# Patient Record
Sex: Female | Born: 1995
Health system: Southern US, Community
[De-identification: ages and names within clinical notes are randomized; demographics above are authoritative.]

## PROBLEM LIST (undated history)

## (undated) DIAGNOSIS — Q766 Other congenital malformations of ribs: Secondary | ICD-10-CM

## (undated) DIAGNOSIS — G43909 Migraine, unspecified, not intractable, without status migrainosus: Secondary | ICD-10-CM

## (undated) HISTORY — DX: Other congenital malformations of ribs: Q76.6

## (undated) HISTORY — DX: Migraine, unspecified, not intractable, without status migrainosus: G43.909

---

## 2017-05-29 ENCOUNTER — Encounter (HOSPITAL_COMMUNITY): Payer: Self-pay

## 2017-05-29 ENCOUNTER — Emergency Department (HOSPITAL_COMMUNITY)
Admission: EM | Admit: 2017-05-29 | Discharge: 2017-05-29 | Disposition: A | Discharge status: Home / Self Care | Payer: BLUE CROSS/BLUE SHIELD | Attending: Emergency Medicine | Admitting: Emergency Medicine

## 2017-05-29 DIAGNOSIS — R21 Rash and other nonspecific skin eruption: Secondary | ICD-10-CM | POA: Diagnosis present

## 2017-05-29 DIAGNOSIS — T7840XA Allergy, unspecified, initial encounter: Secondary | ICD-10-CM

## 2017-05-29 MED ORDER — PREDNISONE 10 MG PO TABS: 50.0000 mg | ORAL_TABLET | Freq: Every day | ORAL | 0 refills | Status: AC

## 2017-05-29 MED ORDER — DEXAMETHASONE SODIUM PHOSPHATE 10 MG/ML IJ SOLN
10.0000 mg | Freq: Once | INTRAMUSCULAR | Status: AC
  Administered 2017-05-29: 10 mg via INTRAMUSCULAR
  Filled 2017-05-29: qty 1

## 2017-05-29 MED ORDER — DIPHENHYDRAMINE HCL 25 MG PO CAPS
25.0000 mg | ORAL_CAPSULE | Freq: Once | ORAL | Status: AC
  Administered 2017-05-29: 25 mg via ORAL
  Filled 2017-05-29: qty 1

## 2017-05-29 MED ORDER — HYDROXYZINE HCL 25 MG PO TABS: 25.0000 mg | ORAL_TABLET | Freq: Three times a day (TID) | ORAL | 0 refills | Status: DC | PRN

## 2017-05-29 NOTE — ED Provider Notes (Signed)
[facility]-EMERGENCY DEPT Provider Note   CSN: [id number] Arrival date & time: 05/29/17  1036     History   Chief Complaint No chief complaint on file.   HPI Jane Parker is a 21 y.o. female presenting with allergic reaction to hair dye.   Pt states she used hair dye to color her hair on Monday. Soon after and Tuesday, she had itching and rash on the base of her neck where her hair touches, Yesterday she had spread of the rash to her whole scalp and hairline. Today she woke up with edematous swelling of her eyes. She denies swelling of tongue, lips, mouth or throat, she denies difficulty breathing, taking, or with PO intake. She took a benadryl on Tuesday and yesterday, and a vistaril last night. She has not taken anything today. She had a reaction to hair dye 6 years ago. She has not other medical problems and does not take any medications. She denies other new exposures including new detergents, soaps, foods, environments, or shampoos/conditioners.   HPI  History reviewed. No pertinent past medical history.  There are no active problems to display for this patient.   History reviewed. No pertinent surgical history.  OB History    No data available       Home Medications    Prior to Admission medications   Medication Sig Start Date End Date Taking? Authorizing Provider  hydrOXYzine (ATARAX/VISTARIL) 25 MG tablet Take 1 tablet (25 mg total) by mouth every 8 (eight) hours as needed for itching. 05/29/17   Shakia Sebastiano, PA-C  predniSONE (DELTASONE) 10 MG tablet Take 5 tablets (50 mg total) by mouth daily. 05/29/17 06/03/17  Atanacio Melnyk, PA-C    Family History No family history on file.  Social History Social History  Substance Use Topics  . Smoking status: Never Smoker  . Smokeless tobacco: Never Used  . Alcohol use Not on file     Allergies   Patient has no known allergies.   Review of Systems Review of Systems  Constitutional: Negative for chills and  fever.  HENT: Positive for ear pain and facial swelling. Negative for drooling, sinus pain, sinus pressure, sore throat, trouble swallowing and voice change.   Eyes: Negative for pain, discharge and visual disturbance.  Respiratory: Negative for cough, shortness of breath, wheezing and stridor.   Cardiovascular: Negative for chest pain.  Gastrointestinal: Negative for abdominal pain, nausea and vomiting.     Physical Exam Updated Vital Signs BP 125/78 (BP Location: Left Arm)   Pulse 84   Temp (!) 97.5 F (36.4 C) (Oral)   Resp 18   SpO2 100%   Physical Exam  Constitutional: She is oriented to person, place, and time. She appears well-developed and well-nourished. No distress.  HENT:  Head: Normocephalic and atraumatic.  Right Ear: Tympanic membrane, external ear and ear canal normal.  Left Ear: External ear and ear canal normal. Tympanic membrane is not perforated, not erythematous and not bulging.  Ears:  Nose: Nose normal.  Mouth/Throat: Uvula is midline, oropharynx is clear and moist and mucous membranes are normal.  TMs nonerythematous and not bulging. L TM with serous filled lesions/blisters. No fluid behind the TM.  No swelling of lips, tongue or throat. Pt voice at baseline. No irritation, erythema, or swelling of OP.   Eyes: Pupils are equal, round, and reactive to light. Conjunctivae and EOM are normal.  Neck: Normal range of motion.  Cardiovascular: Normal rate, regular rhythm and intact distal pulses.   Pulmonary/Chest:  Effort normal and breath sounds normal. She has no decreased breath sounds. She has no wheezes. She has no rhonchi. She has no rales.  Pt talking in complete sentences without difficulty. Good air movement in all lung fields.   Abdominal: Soft. She exhibits no distension. There is no tenderness.  Musculoskeletal: Normal range of motion.  Lymphadenopathy:    She has no cervical adenopathy.  Neurological: She is alert and oriented to person, place, and  time.  Skin: Skin is warm. No rash noted.  Macular rash along hairline and in scalp. Some lesions vesicular and some macular. Extension of rash to posterior neck. No rash notes elsewhere. No rash on face or chest. No rash on anterior neck. No hives or urticaria seen.   Psychiatric: She has a normal mood and affect.  Nursing note and vitals reviewed.    ED Treatments / Results  Labs (all labs ordered are listed, but only abnormal results are displayed) Labs Reviewed - No data to display  EKG  EKG Interpretation None       Radiology No results found.  Procedures Procedures (including critical care time)  Medications Ordered in ED Medications  dexamethasone (DECADRON) injection 10 mg (10 mg Intramuscular Given 05/29/17 1426)  diphenhydrAMINE (BENADRYL) capsule 25 mg (25 mg Oral Given 05/29/17 1426)     Initial Impression / Assessment and Plan / ED Course  I have reviewed the triage vital signs and the nursing notes.  Pertinent labs & imaging results that were available during my care of the patient were reviewed by me and considered in my medical decision making (see chart for details).     Pt presenting with reaction to hair dye. Began as rash, and has cause periorbital edema. No oral swelling or respiratory compromise. Pt speaking in full sentences and can tolerate PO easily. Will give decadron shot and benadryl. Rx for prednisne and vistaril. Pt to monitor closely for progression of swelling. Stressed importance of immediate return to ED if oral swelling, throat swelling, or difficulty breathing. Pt tolerated PO in the ED. At this time, pt appears safe for discharge. Strict return precautions given. Pt states she understands and agrees to plan.   Final Clinical Impressions(s) / ED Diagnoses   Final diagnoses:  Allergic reaction, initial encounter    New Prescriptions Discharge Medication List as of 05/29/2017  2:51 PM    START taking these medications   Details    hydrOXYzine (ATARAX/VISTARIL) 25 MG tablet Take 1 tablet (25 mg total) by mouth every 8 (eight) hours as needed for itching., Starting Thu 05/29/2017, Print    predniSONE (DELTASONE) 10 MG tablet Take 5 tablets (50 mg total) by mouth daily., Starting Thu 05/29/2017, Until Tue 06/03/2017, Print         Leaf, Takeem Krotzer, PA-C 05/29/17 2247    Loren Racer, MD 05/30/17 [phone]

## 2017-05-29 NOTE — Discharge Instructions (Signed)
Take prednisone as prescribed. Use Vistaril 3 times a day as needed for itching.  Follow-up with your primary care doctor in 3-7 days if symptoms are improving, but not gone. It is very important that you pay close attention to how your symptoms are progressing. If there is any worsening, you develop swelling of your tongue/lips/mouth, difficulty breathing, or throat itching, you must come back to the emergency room immediately.

## 2017-05-29 NOTE — ED Triage Notes (Signed)
Patient here with bilateral eye swelling after allergic reaction to hair dye that she used on Monday. No SOB, no throat swelling. Has taken benadryl

## 2017-09-04 ENCOUNTER — Other Ambulatory Visit: Payer: Self-pay

## 2017-09-04 ENCOUNTER — Ambulatory Visit (INDEPENDENT_AMBULATORY_CARE_PROVIDER_SITE_OTHER): Payer: BLUE CROSS/BLUE SHIELD | Admitting: Physician Assistant

## 2017-09-04 ENCOUNTER — Encounter: Payer: Self-pay | Admitting: Physician Assistant

## 2017-09-04 VITALS — BP 110/66 | HR 75 | Temp 98.1°F | Resp 18 | Ht 64.96 in | Wt 158.6 lb

## 2017-09-04 DIAGNOSIS — Z13220 Encounter for screening for lipoid disorders: Secondary | ICD-10-CM | POA: Diagnosis not present

## 2017-09-04 DIAGNOSIS — Z13228 Encounter for screening for other metabolic disorders: Secondary | ICD-10-CM | POA: Diagnosis not present

## 2017-09-04 DIAGNOSIS — Z1329 Encounter for screening for other suspected endocrine disorder: Secondary | ICD-10-CM

## 2017-09-04 DIAGNOSIS — Z23 Encounter for immunization: Secondary | ICD-10-CM

## 2017-09-04 DIAGNOSIS — Z1389 Encounter for screening for other disorder: Secondary | ICD-10-CM | POA: Diagnosis not present

## 2017-09-04 DIAGNOSIS — Z0000 Encounter for general adult medical examination without abnormal findings: Secondary | ICD-10-CM

## 2017-09-04 DIAGNOSIS — Z111 Encounter for screening for respiratory tuberculosis: Secondary | ICD-10-CM

## 2017-09-04 DIAGNOSIS — Z130 Encounter for screening for diseases of the blood and blood-forming organs and certain disorders involving the immune mechanism: Secondary | ICD-10-CM

## 2017-09-04 LAB — POCT URINALYSIS DIP (MANUAL ENTRY)
BILIRUBIN UA: NEGATIVE mg/dL
Bilirubin, UA: NEGATIVE
Glucose, UA: NEGATIVE mg/dL
Leukocytes, UA: NEGATIVE
Nitrite, UA: NEGATIVE
PROTEIN UA: NEGATIVE mg/dL
RBC UA: NEGATIVE
Urobilinogen, UA: 0.2 E.U./dL
pH, UA: 6 (ref 5.0–8.0)

## 2017-09-04 NOTE — Patient Instructions (Addendum)
We should have your blood results back within the next few days. Sign up for mychart so we can release your blood work that way. When you are ready to have your pap smear, please return to office. Thank you for letting me participate in your health and well being.   Health Maintenance, Female Adopting a healthy lifestyle and getting preventive care can go a long way to promote health and wellness. Talk with your health care provider about what schedule of regular examinations is right for you. This is a good chance for you to check in with your provider about disease prevention and staying healthy. In between checkups, there are plenty of things you can do on your own. Experts have done a lot of research about which lifestyle changes and preventive measures are most likely to keep you healthy. Ask your health care provider for more information. Weight and diet Eat a healthy diet  Be sure to include plenty of vegetables, fruits, low-fat dairy products, and lean protein.  Do not eat a lot of foods high in solid fats, added sugars, or salt.  Get regular exercise. This is one of the most important things you can do for your health. ? Most adults should exercise for at least 150 minutes each week. The exercise should increase your heart rate and make you sweat (moderate-intensity exercise). ? Most adults should also do strengthening exercises at least twice a week. This is in addition to the moderate-intensity exercise.  Maintain a healthy weight  Body mass index (BMI) is a measurement that can be used to identify possible weight problems. It estimates body fat based on height and weight. Your health care provider can help determine your BMI and help you achieve or maintain a healthy weight.  For females 40 years of age and older: ? A BMI below 18.5 is considered underweight. ? A BMI of 18.5 to 24.9 is normal. ? A BMI of 25 to 29.9 is considered overweight. ? A BMI of 30 and above is considered  obese.  Watch levels of cholesterol and blood lipids  You should start having your blood tested for lipids and cholesterol at 21 years of age, then have this test every 5 years.  You may need to have your cholesterol levels checked more often if: ? Your lipid or cholesterol levels are high. ? You are older than 21 years of age. ? You are at high risk for heart disease.  Cancer screening Lung Cancer  Lung cancer screening is recommended for adults 85-15 years old who are at high risk for lung cancer because of a history of smoking.  A yearly low-dose CT scan of the lungs is recommended for people who: ? Currently smoke. ? Have quit within the past 15 years. ? Have at least a 30-pack-year history of smoking. A pack year is smoking an average of one pack of cigarettes a day for 1 year.  Yearly screening should continue until it has been 15 years since you quit.  Yearly screening should stop if you develop a health problem that would prevent you from having lung cancer treatment.  Breast Cancer  Practice breast self-awareness. This means understanding how your breasts normally appear and feel.  It also means doing regular breast self-exams. Let your health care provider know about any changes, no matter how small.  If you are in your 20s or 30s, you should have a clinical breast exam (CBE) by a health care provider every 1-3 years as part  of a regular health exam.  If you are 30 or older, have a CBE every year. Also consider having a breast X-ray (mammogram) every year.  If you have a family history of breast cancer, talk to your health care provider about genetic screening.  If you are at high risk for breast cancer, talk to your health care provider about having an MRI and a mammogram every year.  Breast cancer gene (BRCA) assessment is recommended for women who have family members with BRCA-related cancers. BRCA-related cancers  include: ? Breast. ? Ovarian. ? Tubal. ? Peritoneal cancers.  Results of the assessment will determine the need for genetic counseling and BRCA1 and BRCA2 testing.  Cervical Cancer Your health care provider may recommend that you be screened regularly for cancer of the pelvic organs (ovaries, uterus, and vagina). This screening involves a pelvic examination, including checking for microscopic changes to the surface of your cervix (Pap test). You may be encouraged to have this screening done every 3 years, beginning at age 70.  For women ages 17-65, health care providers may recommend pelvic exams and Pap testing every 3 years, or they may recommend the Pap and pelvic exam, combined with testing for human papilloma virus (HPV), every 5 years. Some types of HPV increase your risk of cervical cancer. Testing for HPV may also be done on women of any age with unclear Pap test results.  Other health care providers may not recommend any screening for nonpregnant women who are considered low risk for pelvic cancer and who do not have symptoms. Ask your health care provider if a screening pelvic exam is right for you.  If you have had past treatment for cervical cancer or a condition that could lead to cancer, you need Pap tests and screening for cancer for at least 20 years after your treatment. If Pap tests have been discontinued, your risk factors (such as having a new sexual partner) need to be reassessed to determine if screening should resume. Some women have medical problems that increase the chance of getting cervical cancer. In these cases, your health care provider may recommend more frequent screening and Pap tests.  Colorectal Cancer  This type of cancer can be detected and often prevented.  Routine colorectal cancer screening usually begins at 21 years of age and continues through 20 years of age.  Your health care provider may recommend screening at an earlier age if you have risk factors  for colon cancer.  Your health care provider may also recommend using home test kits to check for hidden blood in the stool.  A small camera at the end of a tube can be used to examine your colon directly (sigmoidoscopy or colonoscopy). This is done to check for the earliest forms of colorectal cancer.  Routine screening usually begins at age 85.  Direct examination of the colon should be repeated every 5-10 years through 21 years of age. However, you may need to be screened more often if early forms of precancerous polyps or small growths are found.  Skin Cancer  Check your skin from head to toe regularly.  Tell your health care provider about any new moles or changes in moles, especially if there is a change in a mole's shape or color.  Also tell your health care provider if you have a mole that is larger than the size of a pencil eraser.  Always use sunscreen. Apply sunscreen liberally and repeatedly throughout the day.  Protect yourself by wearing long  sleeves, pants, a wide-brimmed hat, and sunglasses whenever you are outside.  Heart disease, diabetes, and high blood pressure  High blood pressure causes heart disease and increases the risk of stroke. High blood pressure is more likely to develop in: ? People who have blood pressure in the high end of the normal range (130-139/85-89 mm Hg). ? People who are overweight or obese. ? People who are African American.  If you are 93-79 years of age, have your blood pressure checked every 3-5 years. If you are 85 years of age or older, have your blood pressure checked every year. You should have your blood pressure measured twice-once when you are at a hospital or clinic, and once when you are not at a hospital or clinic. Record the average of the two measurements. To check your blood pressure when you are not at a hospital or clinic, you can use: ? An automated blood pressure machine at a pharmacy. ? A home blood pressure monitor.  If  you are between 74 years and 26 years old, ask your health care provider if you should take aspirin to prevent strokes.  Have regular diabetes screenings. This involves taking a blood sample to check your fasting blood sugar level. ? If you are at a normal weight and have a low risk for diabetes, have this test once every three years after 21 years of age. ? If you are overweight and have a high risk for diabetes, consider being tested at a younger age or more often. Preventing infection Hepatitis B  If you have a higher risk for hepatitis B, you should be screened for this virus. You are considered at high risk for hepatitis B if: ? You were born in a country where hepatitis B is common. Ask your health care provider which countries are considered high risk. ? Your parents were born in a high-risk country, and you have not been immunized against hepatitis B (hepatitis B vaccine). ? You have HIV or AIDS. ? You use needles to inject street drugs. ? You live with someone who has hepatitis B. ? You have had sex with someone who has hepatitis B. ? You get hemodialysis treatment. ? You take certain medicines for conditions, including cancer, organ transplantation, and autoimmune conditions.  Hepatitis C  Blood testing is recommended for: ? Everyone born from [birth year] through 1965. ? Anyone with known risk factors for hepatitis C.  Sexually transmitted infections (STIs)  You should be screened for sexually transmitted infections (STIs) including gonorrhea and chlamydia if: ? You are sexually active and are younger than 21 years of age. ? You are older than 21 years of age and your health care provider tells you that you are at risk for this type of infection. ? Your sexual activity has changed since you were last screened and you are at an increased risk for chlamydia or gonorrhea. Ask your health care provider if you are at risk.  If you do not have HIV, but are at risk, it may be recommended  that you take a prescription medicine daily to prevent HIV infection. This is called pre-exposure prophylaxis (PrEP). You are considered at risk if: ? You are sexually active and do not regularly use condoms or know the HIV status of your partner(s). ? You take drugs by injection. ? You are sexually active with a partner who has HIV.  Talk with your health care provider about whether you are at high risk of being infected with HIV. If  you choose to begin PrEP, you should first be tested for HIV. You should then be tested every 3 months for as long as you are taking PrEP. Pregnancy  If you are premenopausal and you may become pregnant, ask your health care provider about preconception counseling.  If you may become pregnant, take 400 to 800 micrograms (mcg) of folic acid every day.  If you want to prevent pregnancy, talk to your health care provider about birth control (contraception). Osteoporosis and menopause  Osteoporosis is a disease in which the bones lose minerals and strength with aging. This can result in serious bone fractures. Your risk for osteoporosis can be identified using a bone density scan.  If you are 4 years of age or older, or if you are at risk for osteoporosis and fractures, ask your health care provider if you should be screened.  Ask your health care provider whether you should take a calcium or vitamin D supplement to lower your risk for osteoporosis.  Menopause may have certain physical symptoms and risks.  Hormone replacement therapy may reduce some of these symptoms and risks. Talk to your health care provider about whether hormone replacement therapy is right for you. Follow these instructions at home:  Schedule regular health, dental, and eye exams.  Stay current with your immunizations.  Do not use any tobacco products including cigarettes, chewing tobacco, or electronic cigarettes.  If you are pregnant, do not drink alcohol.  If you are  breastfeeding, limit how much and how often you drink alcohol.  Limit alcohol intake to no more than 1 drink per day for nonpregnant women. One drink equals 12 ounces of beer, 5 ounces of wine, or 1 ounces of hard liquor.  Do not use street drugs.  Do not share needles.  Ask your health care provider for help if you need support or information about quitting drugs.  Tell your health care provider if you often feel depressed.  Tell your health care provider if you have ever been abused or do not feel safe at home. This information is not intended to replace advice given to you by your health care provider. Make sure you discuss any questions you have with your health care provider. Document Released: 03/18/2011 Document Revised: 02/08/2016 Document Reviewed: 06/06/2015 Elsevier Interactive Patient Education  2018 [employer].   IF you received an x-ray today, you will receive an invoice from [facility]. Please contact [facility] at [phone] with questions or concerns regarding your invoice.   IF you received labwork today, you will receive an invoice from [place]. Please contact [facility] at [phone] with questions or concerns regarding your invoice.   Our billing staff will not be able to assist you with questions regarding bills from these companies.  You will be contacted with the lab results as soon as they are available. The fastest way to get your results is to activate your My Chart account. Instructions are located on the last page of this paperwork. If you have not heard from [place] regarding the results in 2 weeks, please contact this office.

## 2017-09-04 NOTE — Progress Notes (Signed)
Jane Parker  MRN: [id number] DOB: April 04, 1996  Subjective:  Pt is a 21 y.o. female who presents for annual physical exam.  Patient is fasting today.   Last menstrual cycle: Patient is currently on menstrual cycle.  Notes that her typically regular.  Denies dysmenorrhea or menorrhagia. Last dental exam: 2016, brushes twice daily Last vision exam: Never Last pap smear: Never  Vaccinations      Tetanus: 2017      HPV: Completed in 2015  There are no active problems to display for this patient.   Current Outpatient Medications on File Prior to Visit  Medication Sig Dispense Refill  . hydrOXYzine (ATARAX/VISTARIL) 25 MG tablet Take 1 tablet (25 mg total) by mouth every 8 (eight) hours as needed for itching. (Patient not taking: Reported on 09/04/2017) 30 tablet 0   No current facility-administered medications on file prior to visit.     No Known Allergies  Social History   Socioeconomic History  . Marital status: Single    Spouse name: None  . Number of children: None  . Years of education: None  . Highest education level: High school graduate  Social Needs  . Financial resource strain: None  . Food insecurity - worry: Never true  . Food insecurity - inability: Never true  . Transportation needs - medical: No  . Transportation needs - non-medical: No  Occupational History  . Occupation: Student     Comment: [employer]  Tobacco Use  . Smoking status: Never Smoker  . Smokeless tobacco: Never Used  Substance and Sexual Activity  . Alcohol use: Yes    Comment: occ  . Drug use: No  . Sexual activity: Not Currently    Partners: Male    Birth control/protection: Condom  Other Topics Concern  . None  Social History Narrative   Pt grew up in [place]. 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 Attends [employer] for [employer].       Diet: Eats anything. Likes junk food but will eat veggies and fruits. Drinks milk, water, and gatorade. Does not  take a multivitamin.     History reviewed. No pertinent surgical history.  Family History  Problem Relation Age of Onset  . Alzheimer's disease Maternal Grandmother     Review of Systems  Constitutional: Negative for activity change, appetite change, chills, diaphoresis, fatigue, fever and unexpected weight change.  HENT: Negative for congestion, dental problem, drooling, ear discharge, ear pain, facial swelling, hearing loss, mouth sores, nosebleeds, postnasal drip, rhinorrhea, sinus pressure, sinus pain, sneezing, sore throat, tinnitus, trouble swallowing and voice change.   Eyes: Negative for photophobia, pain, discharge, redness, itching and visual disturbance.  Respiratory: Negative for apnea, cough, choking, chest tightness, shortness of breath, wheezing and stridor.   Cardiovascular: Negative for chest pain, palpitations and leg swelling.  Gastrointestinal: Negative for abdominal distention, abdominal pain, anal bleeding, blood in stool, constipation, diarrhea, nausea, rectal pain and vomiting.  Endocrine: Negative for cold intolerance, heat intolerance, polydipsia, polyphagia and polyuria.  Genitourinary: Negative for decreased urine volume, difficulty urinating, dyspareunia, dysuria, enuresis, flank pain, frequency, genital sores, hematuria, menstrual problem, pelvic pain, urgency, vaginal bleeding, vaginal discharge and vaginal pain.  Musculoskeletal: Negative for arthralgias, back pain, gait problem, joint swelling, myalgias, neck pain and neck stiffness.  Skin: Negative for color change, pallor, rash and wound.  Allergic/Immunologic: Negative for environmental allergies, food allergies and immunocompromised state.  Neurological: Negative for dizziness, tremors, seizures, syncope, facial asymmetry, speech difficulty, weakness,  light-headedness, numbness and headaches.  Hematological: Negative for adenopathy. Does not bruise/bleed easily.  Psychiatric/Behavioral: Negative for  agitation, behavioral problems, confusion, decreased concentration, dysphoric mood, hallucinations, self-injury, sleep disturbance and suicidal ideas. The patient is not nervous/anxious and is not hyperactive.     Objective:  BP 110/66 (BP Location: Left Arm, Patient Position: Sitting, Cuff Size: Normal)   Pulse 75   Temp 98.1 F (36.7 C) (Oral)   Resp 18   Ht 5' 4.96" (1.65 m)   Wt 158 lb 9.6 oz (71.9 kg)   LMP 09/02/2017 (Exact Date)   SpO2 100%   BMI 26.42 kg/m   Physical Exam  Constitutional: She is oriented to person, place, and time and well-developed, well-nourished, and in no distress.  HENT:  Head: Normocephalic and atraumatic.  Right Ear: Hearing, tympanic membrane, external ear and ear canal normal.  Left Ear: Hearing, tympanic membrane, external ear and ear canal normal.  Nose: Nose normal.  Mouth/Throat: Uvula is midline, oropharynx is clear and moist and mucous membranes are normal. No oropharyngeal exudate.  Eyes: Conjunctivae, EOM and lids are normal. Pupils are equal, round, and reactive to light. No scleral icterus.  Neck: Trachea normal and normal range of motion. No thyroid mass and no thyromegaly present.  Cardiovascular: Normal rate, regular rhythm, normal heart sounds and intact distal pulses.  Pulmonary/Chest: Effort normal and breath sounds normal.  Abdominal: Soft. Normal appearance and bowel sounds are normal. There is no tenderness.  Lymphadenopathy:       Head (right side): No tonsillar, no preauricular, no posterior auricular and no occipital adenopathy present.       Head (left side): No tonsillar, no preauricular, no posterior auricular and no occipital adenopathy present.    She has no cervical adenopathy.       Right: No supraclavicular adenopathy present.       Left: No supraclavicular adenopathy present.  Neurological: She is alert and oriented to person, place, and time. She has normal sensation, normal strength and normal reflexes. Gait normal.    Skin: Skin is warm and dry.  Psychiatric: Affect normal.    Visual Acuity Screening   Right eye Left eye Both eyes  Without correction: 20/15 20/15 20/15   With correction:       Assessment and Plan :  Discussed healthy lifestyle, diet, exercise, preventative care, vaccinations, and addressed patient's concerns. Plan for follow up in one year. Otherwise, plan for specific conditions below.  1. Annual physical exam Patient declines Pap smear today.  Await lab results.  School form completed and given to patient.  2. Need for prophylactic vaccination and inoculation against influenza - Flu Vaccine QUAD 36+ mos IM  3. Screening, anemia, deficiency, iron - CBC with Differential/Platelet  4. Screening for metabolic disorder - XKG81+EHUD  5. Screening, lipid - Lipid panel  6. Screening for thyroid disorder - TSH  7. Screening for hematuria or proteinuria - POCT urinalysis dipstick  8. Screening-pulmonary TB - QuantiFERON-TB Gold Plus  Tenna Delaine, PA-C  [facility] 09/04/2017 9:41 AM

## 2017-09-08 LAB — CBC WITH DIFFERENTIAL/PLATELET
BASOS: 1 %
Basophils Absolute: 0 10*3/uL (ref 0.0–0.2)
EOS (ABSOLUTE): 0.1 10*3/uL (ref 0.0–0.4)
EOS: 3 %
HEMATOCRIT: 32.2 % — AB (ref 34.0–46.6)
Hemoglobin: 10 g/dL — ABNORMAL LOW (ref 11.1–15.9)
Immature Grans (Abs): 0 10*3/uL (ref 0.0–0.1)
Immature Granulocytes: 0 %
LYMPHS ABS: 1 10*3/uL (ref 0.7–3.1)
Lymphs: 29 %
MCH: 22.8 pg — AB (ref 26.6–33.0)
MCHC: 31.1 g/dL — AB (ref 31.5–35.7)
MCV: 73 fL — AB (ref 79–97)
MONOS ABS: 0.4 10*3/uL (ref 0.1–0.9)
Monocytes: 12 %
Neutrophils Absolute: 2 10*3/uL (ref 1.4–7.0)
Neutrophils: 55 %
Platelets: 211 10*3/uL (ref 150–379)
RBC: 4.39 x10E6/uL (ref 3.77–5.28)
RDW: 14.9 % (ref 12.3–15.4)
WBC: 3.5 10*3/uL (ref 3.4–10.8)

## 2017-09-08 LAB — CMP14+EGFR
ALBUMIN: 4.2 g/dL (ref 3.5–5.5)
ALK PHOS: 41 IU/L (ref 39–117)
ALT: 11 IU/L (ref 0–32)
AST: 19 IU/L (ref 0–40)
Albumin/Globulin Ratio: 1.9 (ref 1.2–2.2)
BUN / CREAT RATIO: 18 (ref 9–23)
BUN: 11 mg/dL (ref 6–20)
Bilirubin Total: 0.4 mg/dL (ref 0.0–1.2)
CALCIUM: 9 mg/dL (ref 8.7–10.2)
CO2: 20 mmol/L (ref 20–29)
CREATININE: 0.61 mg/dL (ref 0.57–1.00)
Chloride: 104 mmol/L (ref 96–106)
GFR calc Af Amer: 150 mL/min/{1.73_m2} (ref >59)
GFR, EST NON AFRICAN AMERICAN: 130 mL/min/{1.73_m2} (ref >59)
GLOBULIN, TOTAL: 2.2 g/dL (ref 1.5–4.5)
Glucose: 80 mg/dL (ref 65–99)
Potassium: 4.3 mmol/L (ref 3.5–5.2)
SODIUM: 139 mmol/L (ref 134–144)
Total Protein: 6.4 g/dL (ref 6.0–8.5)

## 2017-09-08 LAB — QUANTIFERON-TB GOLD PLUS
QUANTIFERON NIL VALUE: 0.02 [IU]/mL
QUANTIFERON TB2 AG VALUE: 0.02 [IU]/mL
QuantiFERON TB1 Ag Value: 0.02 IU/mL
QuantiFERON-TB Gold Plus: NEGATIVE

## 2017-09-08 LAB — LIPID PANEL
CHOL/HDL RATIO: 2.9 ratio (ref 0.0–4.4)
Cholesterol, Total: 140 mg/dL (ref 100–199)
HDL: 49 mg/dL (ref >39)
LDL Calculated: 79 mg/dL (ref 0–99)
Triglycerides: 59 mg/dL (ref 0–149)
VLDL Cholesterol Cal: 12 mg/dL (ref 5–40)

## 2017-09-08 LAB — TSH: TSH: 1.8 u[IU]/mL (ref 0.450–4.500)

## 2018-04-03 DIAGNOSIS — G43909 Migraine, unspecified, not intractable, without status migrainosus: Secondary | ICD-10-CM | POA: Diagnosis not present

## 2018-05-01 DIAGNOSIS — M898X1 Other specified disorders of bone, shoulder: Secondary | ICD-10-CM | POA: Diagnosis not present

## 2018-05-01 DIAGNOSIS — S46912A Strain of unspecified muscle, fascia and tendon at shoulder and upper arm level, left arm, initial encounter: Secondary | ICD-10-CM | POA: Diagnosis not present

## 2018-05-01 DIAGNOSIS — M25512 Pain in left shoulder: Secondary | ICD-10-CM | POA: Diagnosis not present

## 2018-05-25 DIAGNOSIS — Z23 Encounter for immunization: Secondary | ICD-10-CM | POA: Diagnosis not present

## 2018-07-07 DIAGNOSIS — R109 Unspecified abdominal pain: Secondary | ICD-10-CM | POA: Diagnosis not present

## 2018-12-01 DIAGNOSIS — J020 Streptococcal pharyngitis: Secondary | ICD-10-CM | POA: Diagnosis not present

## 2019-01-01 ENCOUNTER — Other Ambulatory Visit: Payer: Self-pay

## 2019-01-04 ENCOUNTER — Ambulatory Visit (INDEPENDENT_AMBULATORY_CARE_PROVIDER_SITE_OTHER): Payer: BLUE CROSS/BLUE SHIELD | Admitting: Family Medicine

## 2019-01-04 ENCOUNTER — Other Ambulatory Visit: Payer: Self-pay

## 2019-01-04 ENCOUNTER — Encounter: Payer: Self-pay | Admitting: Family Medicine

## 2019-01-04 VITALS — BP 110/62 | HR 73 | Temp 97.9°F | Wt 168.0 lb

## 2019-01-04 DIAGNOSIS — D508 Other iron deficiency anemias: Secondary | ICD-10-CM

## 2019-01-04 DIAGNOSIS — D649 Anemia, unspecified: Secondary | ICD-10-CM | POA: Diagnosis not present

## 2019-01-04 DIAGNOSIS — N920 Excessive and frequent menstruation with regular cycle: Secondary | ICD-10-CM | POA: Insufficient documentation

## 2019-01-04 DIAGNOSIS — R5383 Other fatigue: Secondary | ICD-10-CM | POA: Diagnosis not present

## 2019-01-04 DIAGNOSIS — G43409 Hemiplegic migraine, not intractable, without status migrainosus: Secondary | ICD-10-CM

## 2019-01-04 DIAGNOSIS — G43809 Other migraine, not intractable, without status migrainosus: Secondary | ICD-10-CM

## 2019-01-04 LAB — CBC WITH DIFFERENTIAL/PLATELET
Basophils Absolute: 0 10*3/uL (ref 0.0–0.1)
Basophils Relative: 0.9 % (ref 0.0–3.0)
Eosinophils Absolute: 0.1 10*3/uL (ref 0.0–0.7)
Eosinophils Relative: 3.1 % (ref 0.0–5.0)
HCT: 33.7 % — ABNORMAL LOW (ref 36.0–46.0)
Hemoglobin: 10.7 g/dL — ABNORMAL LOW (ref 12.0–15.0)
Lymphocytes Relative: 47.8 % — ABNORMAL HIGH (ref 12.0–46.0)
Lymphs Abs: 2.1 10*3/uL (ref 0.7–4.0)
MCHC: 31.7 g/dL (ref 30.0–36.0)
MCV: 70.1 fl — ABNORMAL LOW (ref 78.0–100.0)
Monocytes Absolute: 0.5 10*3/uL (ref 0.1–1.0)
Monocytes Relative: 11.3 % (ref 3.0–12.0)
Neutro Abs: 1.6 10*3/uL (ref 1.4–7.7)
Neutrophils Relative %: 36.9 % — ABNORMAL LOW (ref 43.0–77.0)
Platelets: 255 10*3/uL (ref 150.0–400.0)
RBC: 4.8 Mil/uL (ref 3.87–5.11)
RDW: 15.9 % — ABNORMAL HIGH (ref 11.5–15.5)
WBC: 4.4 10*3/uL (ref 4.0–10.5)

## 2019-01-04 LAB — IBC + FERRITIN
Ferritin: 3.8 ng/mL — ABNORMAL LOW (ref 10.0–291.0)
Iron: 36 ug/dL — ABNORMAL LOW (ref 42–145)
Saturation Ratios: 6.2 % — ABNORMAL LOW (ref 20.0–50.0)
Transferrin: 414 mg/dL — ABNORMAL HIGH (ref 212.0–360.0)

## 2019-01-04 LAB — COMPREHENSIVE METABOLIC PANEL
ALT: 15 U/L (ref 0–35)
AST: 19 U/L (ref 0–37)
Albumin: 4.8 g/dL (ref 3.5–5.2)
Alkaline Phosphatase: 49 U/L (ref 39–117)
BUN: 10 mg/dL (ref 6–23)
CO2: 29 mEq/L (ref 19–32)
Calcium: 10.2 mg/dL (ref 8.4–10.5)
Chloride: 100 mEq/L (ref 96–112)
Creatinine, Ser: 0.62 mg/dL (ref 0.40–1.20)
GFR: 119.1 mL/min (ref >60.00)
Glucose, Bld: 83 mg/dL (ref 70–99)
Potassium: 4.3 mEq/L (ref 3.5–5.1)
Sodium: 137 mEq/L (ref 135–145)
Total Bilirubin: 0.6 mg/dL (ref 0.2–1.2)
Total Protein: 7.6 g/dL (ref 6.0–8.3)

## 2019-01-04 LAB — VITAMIN D 25 HYDROXY (VIT D DEFICIENCY, FRACTURES): VITD: 24.1 ng/mL — ABNORMAL LOW (ref 30.00–100.00)

## 2019-01-04 LAB — VITAMIN B12: Vitamin B-12: 735 pg/mL (ref 211–911)

## 2019-01-04 LAB — TSH: TSH: 1.68 u[IU]/mL (ref 0.35–4.50)

## 2019-01-04 MED ORDER — SUMATRIPTAN SUCCINATE 100 MG PO TABS: ORAL_TABLET | ORAL | 2 refills | Status: DC

## 2019-01-04 MED ORDER — NORETHIN-ETH ESTRAD-FE BIPHAS 1 MG-10 MCG / 10 MCG PO TABS: 1.0000 | ORAL_TABLET | Freq: Every day | ORAL | 11 refills | Status: DC

## 2019-01-04 NOTE — Progress Notes (Signed)
Jane Parker DOB: December 15, 1995 Encounter date: 01/04/2019  This is a 23 y.o. female who presents to establish care. Chief Complaint  Patient presents with  . New Patient (Initial Visit)    discuss bc    History of present illness: Never been on birth control in past. Wants to consider this. Periods have been heavier with more cramping. Once a month. Used to last 5 days but now 6-7. On heavy days having to change tampons q hour. Has cramping before it starts and then 2-3 days of menses. Those are heaviest bleeding days. Has been like this last few months. Sister and mom both have heavy periods. Not had issues with excessive bleeding in past.   Also has some borderline anemia. Has had this on and off for years.  Migraines: complicated. Dx at age 46. Did MRI evaluation. Has been having migraines since age 61 - blurred vision, memory loss, left side of body goes numb, vomiting. Usually lasts a couple of hours. When first happened she couldn't even talk. Gets these intermittently. Think stress induced. Some diet induced. Hasn't had one since November. Takes excedrin migraine. Has tried topamax, but has hard time with side effects. Starts with blurred vision, then numbness in hand.  LMP was 12/23/18   Past Medical History:  Diagnosis Date  . 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 Migraines    History reviewed. No pertinent surgical history. No Known Allergies Current Meds  Medication Sig  . cyanocobalamin 1000 MCG tablet Take 1,000 mcg by mouth daily.   Social History   Tobacco Use  . Smoking status: Never Smoker  . Smokeless tobacco: Never Used  Substance Use Topics  . Alcohol use: Yes    Comment: occ   Family History  Problem Relation Age of Onset  . Asthma Mother   . Depression Mother   . Miscarriages / [place] Mother        1 miscarriage with twins  . Other Father        no relationship with dad  . Alzheimer's disease Maternal Grandmother   . Depression Maternal  Grandmother   . Heart attack Paternal Grandfather 50  . Kidney disease Paternal Grandfather   . Renal cancer Paternal Grandfather   . Bleeding Disorder Sister      Review of Systems  Constitutional: Negative for chills, fatigue and fever.  Respiratory: Negative for cough, chest tightness, shortness of breath and wheezing.   Cardiovascular: Negative for chest pain, palpitations and leg swelling.  Gastrointestinal: Abdominal pain: cramping w mense.  Genitourinary: Positive for menstrual problem.  Skin: Positive for pallor.  Neurological: Positive for numbness (w headache) and headaches.  Psychiatric/Behavioral: The patient is not nervous/anxious.     Objective:  BP 110/62 (BP Location: Left Arm, Patient Position: Sitting, Cuff Size: Normal)   Pulse 73   Temp 97.9 F (36.6 C) (Oral)   Wt 168 lb (76.2 kg)   SpO2 99%   BMI 27.99 kg/m   Weight: 168 lb (76.2 kg)   BP Readings from Last 3 Encounters:  01/04/19 110/62  09/04/17 110/66  05/29/17 125/78   Wt Readings from Last 3 Encounters:  01/04/19 168 lb (76.2 kg)  09/04/17 158 lb 9.6 oz (71.9 kg)    Physical Exam Constitutional:      General: She is not in acute distress.    Appearance: She is well-developed.  Eyes:     Comments: Pale conjunctiva  Cardiovascular:     Rate and Rhythm: Normal rate  and regular rhythm.     Heart sounds: Normal heart sounds. No murmur. No friction rub.  Pulmonary:     Effort: Pulmonary effort is normal. No respiratory distress.     Breath sounds: Normal breath sounds. No wheezing or rales.  Abdominal:     General: Abdomen is flat. Bowel sounds are normal. There is no distension.     Palpations: Abdomen is soft.     Tenderness: There is no abdominal tenderness. There is no guarding.  Musculoskeletal:     Right lower leg: No edema.     Left lower leg: No edema.  Skin:    Coloration: Skin is pale.  Neurological:     Mental Status: She is alert and oriented to person, place, and time.   Psychiatric:        Behavior: Behavior normal.     Assessment/Plan:  1. Other iron deficiency anemia We will check blood work.  Based on previous lab results from 2018 it appears she has some iron deficiency anemia.  I am concerned that this may be worsened due to her heavy menses.  2. Menorrhagia with regular cycle She is interested in starting oral contraceptive pill to help with lightening cycle duration and flow.  We have discussed other options including Depakote, but she has worries about weight gain with medications.  Discussed that it may take a few months for her period to regulate.  Okay to start pill either with start of next cycle or can start at present time. Discussed new medication(s) today with patient. Discussed potential side effects and patient verbalized understanding.  - CBC with Differential/Platelet; Future - Comprehensive metabolic panel; Future - TSH; Future - Norethindrone-Ethinyl Estradiol-Fe Biphas (LO LOESTRIN FE) 1 MG-10 MCG / 10 MCG tablet; Take 1 tablet by mouth daily.  Dispense: 1 Package; Refill: 11 - TSH - Comprehensive metabolic panel - CBC with Differential/Platelet  3. Anemia, unspecified type - Vitamin B12; Future - IBC + Ferritin; Future - IBC + Ferritin - Vitamin B12  4. Other fatigue - VITAMIN D 25 Hydroxy (Vit-D Deficiency, Fractures); Future - VITAMIN D 25 Hydroxy (Vit-D Deficiency, Fractures)  5. Other migraine without status migrainosus, not intractable Trial of imitrex at onset of headache. She not tried migraine importance in the past and I think it would be reasonable to see if we can prevent the neurologic symptoms and see you after migraine starts.  If medication by mouth does not work in time, would certainly consider injections.  I have asked her to update me with results when she tries this medication. Discussed new medication(s) today with patient. Discussed potential side effects and patient verbalized understanding.  -  SUMAtriptan (IMITREX) 100 MG tablet; Take 1 tablet at onset of symptoms. May repeat in 2 hours if headache persists or recurs.  Dispense: 10 tablet; Refill: 2  Return if symptoms worsen or fail to improve.  Theodis ShoveJunell Jacobey Gura, MD

## 2019-01-20 ENCOUNTER — Other Ambulatory Visit: Payer: Self-pay | Admitting: Family Medicine

## 2019-01-20 MED ORDER — METAXALONE 400 MG PO TABS: 400.0000 mg | ORAL_TABLET | Freq: Two times a day (BID) | ORAL | 1 refills | Status: DC | PRN

## 2019-01-20 NOTE — Progress Notes (Signed)
Neck pain; started last night 6/10; improved to 4/10 with ibuprofen. Right side. She does have some tenderness along right paracervical musculature to upper thoracic. ROM slightly limited. Spasm of right paracervical muscles. No radiation of pain. No neuropathy. No weakness.   Muscle relaxer bedtime; cont with ibuprofen. Heat, stretching. Let me know if worsening. Discussed making desk/computer/phone more ergonomically friendly.

## 2019-03-02 ENCOUNTER — Other Ambulatory Visit: Payer: Self-pay

## 2019-03-02 ENCOUNTER — Telehealth: Payer: BC Managed Care – PPO

## 2019-03-02 ENCOUNTER — Telehealth: Payer: BC Managed Care – PPO | Admitting: Adult Health

## 2019-06-10 ENCOUNTER — Encounter: Payer: Self-pay | Admitting: Family Medicine

## 2019-06-10 ENCOUNTER — Other Ambulatory Visit: Payer: Self-pay

## 2019-06-10 ENCOUNTER — Ambulatory Visit (INDEPENDENT_AMBULATORY_CARE_PROVIDER_SITE_OTHER): Payer: BC Managed Care – PPO | Admitting: Family Medicine

## 2019-06-10 ENCOUNTER — Ambulatory Visit (INDEPENDENT_AMBULATORY_CARE_PROVIDER_SITE_OTHER): Payer: BC Managed Care – PPO

## 2019-06-10 VITALS — BP 100/60 | HR 85 | Temp 98.2°F | Ht 64.95 in | Wt 178.8 lb

## 2019-06-10 DIAGNOSIS — S93401A Sprain of unspecified ligament of right ankle, initial encounter: Secondary | ICD-10-CM | POA: Diagnosis not present

## 2019-06-10 DIAGNOSIS — M25571 Pain in right ankle and joints of right foot: Secondary | ICD-10-CM | POA: Diagnosis not present

## 2019-06-10 NOTE — Patient Instructions (Signed)
Health Maintenance Due  Topic Date Due  . TETANUS/TDAP  10/31/2014  . PAP-Cervical Cytology Screening  10/31/2016  . PAP SMEAR-Modifier  10/31/2016  . INFLUENZA VACCINE  04/17/2019    Depression screen PHQ 2/9 09/04/2017  Decreased Interest 0  Down, Depressed, Hopeless 0  PHQ - 2 Score 0

## 2019-06-10 NOTE — Progress Notes (Signed)
   Subjective:    Patient ID: Jane Parker, female    DOB: Oct 30, 1995, 23 y.o.   MRN: [id number]  HPI Here for an injury to the right foot that occurred about 2 weeks ago. While she was stepping off a dock onto a boat, her right foot slipped and rammed into a corner of the boat. This caused her foot to twist. She immediately felt pain and the area on top of the foot swelled. Since then she has had some pain but has been able to work and walk on it all day. The swelling has gone down. She ices it and takes Ibuprofen when she gets home in the evenings.    Review of Systems  Constitutional: Negative.   Respiratory: Negative.   Cardiovascular: Negative.   Musculoskeletal: Positive for arthralgias.       Objective:   Physical Exam Constitutional:      Appearance: Normal appearance.     Comments: Walks easily   Cardiovascular:     Rate and Rhythm: Normal rate and regular rhythm.     Pulses: Normal pulses.     Heart sounds: Normal heart sounds.  Pulmonary:     Effort: Pulmonary effort is normal.     Breath sounds: Normal breath sounds.  Musculoskeletal:     Comments: Right medial ankle has a few small ecchymoses superior to the malleolus. She is tender superior to and inferior to the medial malleolus. ROM is full, no crepitus  Neurological:     Mental Status: She is alert.           Assessment & Plan:  Right high ankle sprain. We will send her for Xrays today to rule out a fracture. She will use ice and Ibuprofen prn. She can wear an elastic support sleeve when working. This should heal over the next 2-4 weeks.  Alysia Penna, MD

## 2019-07-04 NOTE — Progress Notes (Signed)
Appointment cancelled

## 2019-07-05 ENCOUNTER — Ambulatory Visit: Payer: BC Managed Care – PPO | Admitting: Family Medicine

## 2019-07-06 ENCOUNTER — Encounter: Payer: Self-pay | Admitting: Adult Health

## 2019-07-06 ENCOUNTER — Ambulatory Visit (INDEPENDENT_AMBULATORY_CARE_PROVIDER_SITE_OTHER): Payer: BC Managed Care – PPO | Admitting: Adult Health

## 2019-07-06 ENCOUNTER — Other Ambulatory Visit: Payer: Self-pay

## 2019-07-06 VITALS — BP 110/64 | HR 73 | Temp 97.6°F | Wt 178.6 lb

## 2019-07-06 DIAGNOSIS — Z20818 Contact with and (suspected) exposure to other bacterial communicable diseases: Secondary | ICD-10-CM | POA: Diagnosis not present

## 2019-07-06 DIAGNOSIS — J029 Acute pharyngitis, unspecified: Secondary | ICD-10-CM | POA: Diagnosis not present

## 2019-07-06 LAB — POCT RAPID STREP A (OFFICE): Rapid Strep A Screen: NEGATIVE

## 2019-07-06 MED ORDER — FLUCONAZOLE 150 MG PO TABS: 150.0000 mg | ORAL_TABLET | Freq: Once | ORAL | 1 refills | Status: AC

## 2019-07-06 MED ORDER — PENICILLIN V POTASSIUM 500 MG PO TABS: 500.0000 mg | ORAL_TABLET | Freq: Three times a day (TID) | ORAL | 0 refills | Status: AC

## 2019-07-06 NOTE — Progress Notes (Signed)
Subjective:    Patient ID: Jane Parker, female    DOB: [birth year], 23 y.o.   MRN: [id number]  Was seen at [facility] clinic 7 days ago and treated for strep throat with a 5-day course of doxycycline.  Her symptoms improved but today she started noticing a slightly sore throat and then noticed some exudate on the back of her tonsils.  She denies fevers, chills, body aches  Sore Throat  This is a recurrent problem. The current episode started today. The problem has been gradually worsening. Neither side of throat is experiencing more pain than the other. There has been no fever. Associated symptoms include neck pain, swollen glands and trouble swallowing. Pertinent negatives include no congestion, coughing, ear discharge, ear pain, headaches, hoarse voice, shortness of breath or stridor. She has had exposure to strep. She has tried NSAIDs for the symptoms. The treatment provided mild relief.      Review of Systems  Constitutional: Negative.   HENT: Positive for sore throat (mild) and trouble swallowing. Negative for congestion, ear discharge, ear pain, hoarse voice and voice change.   Respiratory: Negative for cough, shortness of breath and stridor.   Cardiovascular: Negative.   Gastrointestinal: Negative.   Musculoskeletal: Positive for neck pain.  Neurological: Negative for headaches.  Psychiatric/Behavioral: Negative.    Past Medical History:  Diagnosis Date  . 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 Migraines     Social History   Socioeconomic History  . Marital status: Single    Spouse name: Not on file  . Number of children: Not on file  . Years of education: Not on file  . Highest education level: High school graduate  Occupational History  . Occupation: Consulting civil engineer     Comment: [employer]  Social Needs  . Financial resource strain: Not on file  . Food insecurity    Worry: Never true    Inability: Never true  . Transportation needs    Medical: No    Non-medical: No   Tobacco Use  . Smoking status: Never Smoker  . Smokeless tobacco: Never Used  Substance and Sexual Activity  . Alcohol use: Yes    Comment: occ  . Drug use: No  . Sexual activity: Not Currently    Partners: Male    Birth control/protection: Condom  Lifestyle  . Physical activity    Days per week: 2 days    Minutes per session: 60 min  . Stress: Not at all  Relationships  . Social connections    Talks on phone: More than three times a week    Gets together: More than three times a week    Attends religious service: Never    Active member of club or organization: No    Attends meetings of clubs or organizations: Never    Relationship status: Never married  . Intimate partner violence    Fear of current or ex partner: No    Emotionally abused: No    Physically abused: No    Forced sexual activity: No  Other Topics Concern  . Not on file  Social History Narrative   Pt grew up in [place]. 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 Attends [employer] for [employer].       Diet: Eats anything. Likes junk food but will eat veggies and fruits. Drinks milk, water, and gatorade. Does not take a multivitamin.       Exercise: Patient exercises for 1 hour at  least twice a week.    No past surgical history on file.  Family History  Problem Relation Age of Onset  . Asthma Mother   . Depression Mother   . Miscarriages / [place] Mother        1 miscarriage with twins  . Other Father        no relationship with dad  . Alzheimer's disease Maternal Grandmother   . Depression Maternal Grandmother   . Heart attack Paternal Grandfather 19  . Kidney disease Paternal Grandfather   . Renal cancer Paternal Grandfather   . Bleeding Disorder Sister     No Known Allergies  Current Outpatient Medications on File Prior to Visit  Medication Sig Dispense Refill  . cyanocobalamin 1000 MCG tablet Take 1,000 mcg by mouth daily.    . Norethindrone-Ethinyl Estradiol-Fe Biphas  (LO LOESTRIN FE) 1 MG-10 MCG / 10 MCG tablet Take 1 tablet by mouth daily. 1 Package 11  . metaxalone (SKELAXIN) 400 MG tablet Take 1-2 tablets (400-800 mg total) by mouth 2 (two) times daily as needed. (Patient not taking: Reported on 07/06/2019) 30 tablet 1   No current facility-administered medications on file prior to visit.     BP 110/64 (BP Location: Left Arm, Patient Position: Sitting, Cuff Size: Normal)   Pulse 73   Temp 97.6 F (36.4 C) (Temporal)   Wt 178 lb 9.6 oz (81 kg)   SpO2 99%   BMI 29.77 kg/m       Objective:   Physical Exam Vitals signs and nursing note reviewed.  Constitutional:      Appearance: Normal appearance.  HENT:     Mouth/Throat:     Mouth: Mucous membranes are moist.     Tongue: No lesions. Tongue does not deviate from midline.     Pharynx: No pharyngeal swelling or uvula swelling.     Tonsils: Tonsillar exudate present. No tonsillar abscesses. 2+ on the right. 2+ on the left.  Cardiovascular:     Rate and Rhythm: Normal rate and regular rhythm.     Pulses: Normal pulses.     Heart sounds: Normal heart sounds.  Pulmonary:     Effort: Pulmonary effort is normal.     Breath sounds: Normal breath sounds.  Skin:    General: Skin is warm and dry.  Neurological:     General: No focal deficit present.     Mental Status: She is alert and oriented to person, place, and time.  Psychiatric:        Mood and Affect: Mood normal.        Behavior: Behavior normal.        Thought Content: Thought content normal.       Assessment & Plan:  1. Streptococcus exposure - Finished abx 5 days ago  - POCT rapid strep A - negative ( likely from abx course)  - Throat culture - penicillin v potassium (VEETID) 500 MG tablet; Take 1 tablet (500 mg total) by mouth 3 (three) times daily for 10 days.  Dispense: 30 tablet; Refill: 0 - fluconazole (DIFLUCAN) 150 MG tablet; Take 1 tablet (150 mg total) by mouth once for 1 dose.  Dispense: 1 tablet; Refill: 1 - Culture,  Group A Strep   Dorothyann Peng, NP

## 2019-07-08 LAB — CULTURE, GROUP A STREP
MICRO NUMBER:: 1009627
SPECIMEN QUALITY:: ADEQUATE

## 2019-08-11 ENCOUNTER — Other Ambulatory Visit: Payer: Self-pay

## 2019-08-11 ENCOUNTER — Encounter: Payer: Self-pay | Admitting: Family Medicine

## 2019-08-11 ENCOUNTER — Ambulatory Visit (INDEPENDENT_AMBULATORY_CARE_PROVIDER_SITE_OTHER): Payer: BC Managed Care – PPO | Admitting: Family Medicine

## 2019-08-11 VITALS — BP 100/70 | HR 75 | Temp 97.9°F | Ht 63.0 in | Wt 183.2 lb

## 2019-08-11 DIAGNOSIS — E559 Vitamin D deficiency, unspecified: Secondary | ICD-10-CM

## 2019-08-11 DIAGNOSIS — Z0000 Encounter for general adult medical examination without abnormal findings: Secondary | ICD-10-CM

## 2019-08-11 DIAGNOSIS — D509 Iron deficiency anemia, unspecified: Secondary | ICD-10-CM

## 2019-08-11 DIAGNOSIS — Z13220 Encounter for screening for lipoid disorders: Secondary | ICD-10-CM

## 2019-08-11 DIAGNOSIS — L858 Other specified epidermal thickening: Secondary | ICD-10-CM

## 2019-08-11 LAB — CBC WITH DIFFERENTIAL/PLATELET
Basophils Absolute: 0 10*3/uL (ref 0.0–0.1)
Basophils Relative: 0.7 % (ref 0.0–3.0)
Eosinophils Absolute: 0.2 10*3/uL (ref 0.0–0.7)
Eosinophils Relative: 2.4 % (ref 0.0–5.0)
HCT: 40.9 % (ref 36.0–46.0)
Hemoglobin: 13.1 g/dL (ref 12.0–15.0)
Lymphocytes Relative: 31.5 % (ref 12.0–46.0)
Lymphs Abs: 2 10*3/uL (ref 0.7–4.0)
MCHC: 32.1 g/dL (ref 30.0–36.0)
MCV: 78.8 fl (ref 78.0–100.0)
Monocytes Absolute: 0.6 10*3/uL (ref 0.1–1.0)
Monocytes Relative: 9 % (ref 3.0–12.0)
Neutro Abs: 3.6 10*3/uL (ref 1.4–7.7)
Neutrophils Relative %: 56.4 % (ref 43.0–77.0)
Platelets: 213 10*3/uL (ref 150.0–400.0)
RBC: 5.19 Mil/uL — ABNORMAL HIGH (ref 3.87–5.11)
RDW: 16.3 % — ABNORMAL HIGH (ref 11.5–15.5)
WBC: 6.4 10*3/uL (ref 4.0–10.5)

## 2019-08-11 LAB — LIPID PANEL
Cholesterol: 215 mg/dL — ABNORMAL HIGH (ref 0–200)
HDL: 61.6 mg/dL (ref >39.00)
LDL Cholesterol: 133 mg/dL — ABNORMAL HIGH (ref 0–99)
NonHDL: 153.41
Total CHOL/HDL Ratio: 3
Triglycerides: 100 mg/dL (ref 0.0–149.0)
VLDL: 20 mg/dL (ref 0.0–40.0)

## 2019-08-11 LAB — VITAMIN D 25 HYDROXY (VIT D DEFICIENCY, FRACTURES): VITD: 28.81 ng/mL — ABNORMAL LOW (ref 30.00–100.00)

## 2019-08-11 LAB — IBC + FERRITIN
Ferritin: 5.1 ng/mL — ABNORMAL LOW (ref 10.0–291.0)
Iron: 58 ug/dL (ref 42–145)
Saturation Ratios: 8.8 % — ABNORMAL LOW (ref 20.0–50.0)
Transferrin: 469 mg/dL — ABNORMAL HIGH (ref 212.0–360.0)

## 2019-08-11 NOTE — Patient Instructions (Signed)
Try a salicylic face wash on your thigh; and then use moisturizer (like eucerin) to follow. If not improving with this, let me know.  I'll be in touch with you when I get your bloodwork.

## 2019-08-11 NOTE — Progress Notes (Signed)
Jane Parker DOB: 1996/05/19 Encounter date: 08/11/2019  This is a 23 y.o. female who presents for complete physical   History of present illness/Additional concerns: Feels a little better overall and feels that iron will be better.   Planning to see gynecology.   Periods are doing a lot better - lasting 2-3 days now. Not heavy at all.  Taking B12; not taking vitamin D. Did take some extra iron for awhile; had some constipation.   Grandfather will be passing soon. In hospice care now.   Migraines have been ok. Hasn't had one in a couple months. Able to make last headache go away with excedrin migraine.  Past Medical History:  Diagnosis Date  . 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 Migraines    History reviewed. No pertinent surgical history. No Known Allergies Current Meds  Medication Sig  . cyanocobalamin 1000 MCG tablet Take 1,000 mcg by mouth daily.  . Norethindrone-Ethinyl Estradiol-Fe Biphas (LO LOESTRIN FE) 1 MG-10 MCG / 10 MCG tablet Take 1 tablet by mouth daily.   Social History   Tobacco Use  . Smoking status: Never Smoker  . Smokeless tobacco: Never Used  Substance Use Topics  . Alcohol use: Yes    Comment: occ   Family History  Problem Relation Age of Onset  . Asthma Mother   . Depression Mother   . Miscarriages / [place] Mother        1 miscarriage with twins  . Other Father        no relationship with dad  . Alzheimer's disease Maternal Grandmother   . Depression Maternal Grandmother   . Colon cancer Maternal Grandfather 75       metastatic  . Heart attack Paternal Grandfather 50  . Kidney disease Paternal Grandfather   . Renal cancer Paternal Grandfather   . Bleeding Disorder Sister      Review of Systems  Constitutional: Negative for activity change, appetite change, chills, fatigue, fever and unexpected weight change.  HENT: Negative for congestion, ear pain, hearing loss, sinus pressure, sinus pain, sore throat and trouble swallowing.    Eyes: Negative for pain and visual disturbance.  Respiratory: Negative for cough, chest tightness, shortness of breath and wheezing.   Cardiovascular: Negative for chest pain, palpitations and leg swelling.  Gastrointestinal: Negative for abdominal pain, blood in stool, constipation, diarrhea, nausea and vomiting.  Genitourinary: Negative for difficulty urinating and menstrual problem.  Musculoskeletal: Negative for arthralgias and back pain.  Skin: Negative for rash.  Neurological: Negative for dizziness, weakness, numbness and headaches.  Hematological: Negative for adenopathy. Does not bruise/bleed easily.  Psychiatric/Behavioral: Negative for sleep disturbance and suicidal ideas. The patient is not nervous/anxious.     CBC:  Lab Results  Component Value Date   WBC 4.4 01/04/2019   HGB 10.7 (L) 01/04/2019   HGB 10.0 (L) 09/04/2017   HCT 33.7 (L) 01/04/2019   HCT 32.2 (L) 09/04/2017   MCH 22.8 (L) 09/04/2017   MCHC 31.7 01/04/2019   RDW 15.9 (H) 01/04/2019   RDW 14.9 09/04/2017   PLT 255.0 01/04/2019   PLT 211 09/04/2017   CMP: Lab Results  Component Value Date   NA 137 01/04/2019   NA 139 09/04/2017   K 4.3 01/04/2019   CL 100 01/04/2019   CO2 29 01/04/2019   GLUCOSE 83 01/04/2019   BUN 10 01/04/2019   BUN 11 09/04/2017   CREATININE 0.62 01/04/2019   LABGLOB 2.2 09/04/2017   GFRAA 150 09/04/2017  CALCIUM 10.2 01/04/2019   PROT 7.6 01/04/2019   PROT 6.4 09/04/2017   AGRATIO 1.9 09/04/2017   BILITOT 0.6 01/04/2019   BILITOT 0.4 09/04/2017   ALKPHOS 49 01/04/2019   ALT 15 01/04/2019   AST 19 01/04/2019   LIPID: Lab Results  Component Value Date   CHOL 140 09/04/2017   TRIG 59 09/04/2017   HDL 49 09/04/2017   LDLCALC 79 09/04/2017   LABVLDL 12 09/04/2017    Objective:  BP 100/70 (BP Location: Left Arm, Patient Position: Sitting, Cuff Size: Large)   Pulse 75   Temp 97.9 F (36.6 C) (Temporal)   Ht 5\' 3"  (1.6 m)   Wt 183 lb 3.2 oz (83.1 kg)   LMP  07/28/2019 (Exact Date)   SpO2 99%   BMI 32.45 kg/m   Weight: 183 lb 3.2 oz (83.1 kg)   BP Readings from Last 3 Encounters:  08/11/19 100/70  07/06/19 110/64  06/10/19 100/60   Wt Readings from Last 3 Encounters:  08/11/19 183 lb 3.2 oz (83.1 kg)  07/06/19 178 lb 9.6 oz (81 kg)  06/10/19 178 lb 12.8 oz (81.1 kg)    Physical Exam Constitutional:      General: She is not in acute distress.    Appearance: She is well-developed.  HENT:     Head: Normocephalic and atraumatic.     Right Ear: External ear normal.     Left Ear: External ear normal.     Mouth/Throat:     Pharynx: No oropharyngeal exudate.  Eyes:     Conjunctiva/sclera: Conjunctivae normal.     Pupils: Pupils are equal, round, and reactive to light.  Neck:     Musculoskeletal: Normal range of motion and neck supple.     Thyroid: No thyromegaly.  Cardiovascular:     Rate and Rhythm: Normal rate and regular rhythm.     Heart sounds: Normal heart sounds. No murmur. No friction rub. No gallop.   Pulmonary:     Effort: Pulmonary effort is normal.     Breath sounds: Normal breath sounds.  Abdominal:     General: Bowel sounds are normal. There is no distension.     Palpations: Abdomen is soft. There is no mass.     Tenderness: There is no abdominal tenderness. There is no guarding.     Hernia: No hernia is present.  Musculoskeletal: Normal range of motion.        General: No tenderness or deformity.  Lymphadenopathy:     Cervical: No cervical adenopathy.  Skin:    General: Skin is warm and dry.     Findings: No rash.     Comments: Keratosis pilaris right upper thigh  Neurological:     Mental Status: She is alert and oriented to person, place, and time.     Deep Tendon Reflexes: Reflexes normal.     Reflex Scores:      Tricep reflexes are 2+ on the right side and 2+ on the left side.      Bicep reflexes are 2+ on the right side and 2+ on the left side.      Brachioradialis reflexes are 2+ on the right side and  2+ on the left side.      Patellar reflexes are 2+ on the right side and 2+ on the left side. Psychiatric:        Speech: Speech normal.        Behavior: Behavior normal.  Thought Content: Thought content normal.     Assessment/Plan: There are no preventive care reminders to display for this patient. Health Maintenance reviewed. 1. Well adult exam Return to exercise.   2. Iron deficiency anemia, unspecified iron deficiency anemia type - CBC with Differential/Platelet; Future - IBC + Ferritin; Future  3. Vitamin D deficiency - VITAMIN D 25 Hydroxy (Vit-D Deficiency, Fractures); Future  4. Lipid screening - Lipid panel; Future  5. Keratosis pilaris Salicylic acid and moisturizer.    Return for pending bloodwork.  Micheline Rough, MD

## 2019-08-11 NOTE — Progress Notes (Signed)
*  Vitamin D is still a little bit low.  You can still take 1 to 2000 units daily. *Your cholesterol looks worse than previous.  It is not bad, but working on going back to that healthy eating and regular exercise will help.  On a positive note, your HDL is much higher likely is a reflection of the exercise you had been doing. *Your iron levels are still very sad.  If you cannot tolerate oral iron supplements, we need to work on a high iron diet.  I have 1 of these is some suggestions I can give you.  Come see me (or google high iron diet).  I really do think you will feel better if we can get your ferritin levels up a little bit, but I think you can have to work on it.  It should help that periods are better as you wont be continuously losing so much blood. *Blood counts are better, hemoglobin is better. Let me know if you have any questions

## 2019-08-11 NOTE — Addendum Note (Signed)
Addended by: Suzette Battiest on: 08/11/2019 10:02 AM   Modules accepted: Orders

## 2019-09-28 ENCOUNTER — Ambulatory Visit (INDEPENDENT_AMBULATORY_CARE_PROVIDER_SITE_OTHER): Payer: BC Managed Care – PPO | Admitting: Family Medicine

## 2019-09-28 ENCOUNTER — Other Ambulatory Visit: Payer: Self-pay

## 2019-09-28 ENCOUNTER — Other Ambulatory Visit (HOSPITAL_COMMUNITY)
Admission: RE | Admit: 2019-09-28 | Discharge: 2019-09-28 | Disposition: A | Discharge status: Home / Self Care | Payer: BC Managed Care – PPO | Source: Ambulatory Visit | Attending: Family Medicine | Admitting: Family Medicine

## 2019-09-28 ENCOUNTER — Encounter: Payer: Self-pay | Admitting: Family Medicine

## 2019-09-28 VITALS — BP 110/64 | HR 94 | Temp 98.1°F | Wt 187.0 lb

## 2019-09-28 DIAGNOSIS — J029 Acute pharyngitis, unspecified: Secondary | ICD-10-CM | POA: Diagnosis not present

## 2019-09-28 DIAGNOSIS — J358 Other chronic diseases of tonsils and adenoids: Secondary | ICD-10-CM

## 2019-09-28 LAB — POCT RAPID STREP A (OFFICE): Rapid Strep A Screen: NEGATIVE

## 2019-09-28 MED ORDER — METHYLPREDNISOLONE 4 MG PO TBPK: ORAL_TABLET | ORAL | 0 refills | Status: DC

## 2019-09-28 NOTE — Progress Notes (Signed)
   Subjective:    Patient ID: Jane Parker, female    DOB: [birth year], 24 y.o.   MRN: [id number]  HPI Here for the onset yesterday of a ST on the left side and seeing a white spot on the left tonsil. Some pain radiates to the left ear. No HA or fever or cough or SOB or body aches or NVD. Taking Ibuprofen.    Review of Systems  Constitutional: Negative.   HENT: Positive for ear pain, mouth sores and sore throat. Negative for congestion, postnasal drip, sinus pressure, sinus pain and sneezing.   Eyes: Negative.   Respiratory: Negative.   Cardiovascular: Negative.   Gastrointestinal: Negative.        Objective:   Physical Exam Constitutional:      Appearance: Normal appearance. She is well-developed. She is not ill-appearing.  HENT:     Right Ear: Tympanic membrane, ear canal and external ear normal.     Left Ear: Tympanic membrane, ear canal and external ear normal.     Nose: Nose normal.     Mouth/Throat:     Comments: Both tonsils appear normal. There is a well defined shallow white ulcer on the left tonsil. There remainder of the OP is clear Eyes:     Conjunctiva/sclera: Conjunctivae normal.  Neck:     Comments: There are several slightly swollen and tender left AC nodes  Pulmonary:     Effort: Pulmonary effort is normal.     Breath sounds: Normal breath sounds.  Neurological:     Mental Status: She is alert.           Assessment & Plan:  Aphthous ulcer. We will send off a throat culture to rule out strep. Treat with a Medrol dose pack for comfort. Recheck prn.  Gershon Crane, MD

## 2019-10-01 LAB — CULTURE, GROUP A STREP (THRC)

## 2019-11-01 ENCOUNTER — Encounter: Payer: Self-pay | Admitting: Family Medicine

## 2019-11-03 MED ORDER — NORETHIN ACE-ETH ESTRAD-FE 1-20 MG-MCG PO TABS: 1.0000 | ORAL_TABLET | Freq: Every day | ORAL | 3 refills | Status: DC

## 2020-01-24 ENCOUNTER — Other Ambulatory Visit: Payer: Self-pay | Admitting: Family Medicine

## 2020-01-24 MED ORDER — CLINDAMYCIN PHOSPHATE 1 % EX LOTN: TOPICAL_LOTION | Freq: Two times a day (BID) | CUTANEOUS | 0 refills | Status: DC

## 2020-05-23 ENCOUNTER — Other Ambulatory Visit: Payer: Self-pay

## 2020-05-24 ENCOUNTER — Encounter: Payer: Self-pay | Admitting: Family Medicine

## 2020-05-24 ENCOUNTER — Ambulatory Visit: Payer: BC Managed Care – PPO | Admitting: Family Medicine

## 2020-05-24 VITALS — BP 116/80 | HR 65 | Temp 98.3°F | Ht 63.0 in | Wt 187.5 lb

## 2020-05-24 DIAGNOSIS — E559 Vitamin D deficiency, unspecified: Secondary | ICD-10-CM

## 2020-05-24 DIAGNOSIS — D649 Anemia, unspecified: Secondary | ICD-10-CM | POA: Diagnosis not present

## 2020-05-24 DIAGNOSIS — G43009 Migraine without aura, not intractable, without status migrainosus: Secondary | ICD-10-CM

## 2020-05-24 NOTE — Progress Notes (Signed)
Jane Parker DOB: 10-Nov-1995 Encounter date: 05/24/2020  This is a 24 y.o. female who presents with Chief Complaint  Patient presents with  . Follow-up    History of present illness: *speech is still messing up. Will have no sx of migraine and still have issues with speech; words not coming out right even though clear in mind. Can be an entire sentence that comes out wrong. Some words just don't come out right.   *has been having a really hard time focusing. Doing school work and difficult to sit, focus, read. Kind of has been issue in past, but never this bad. Not been like this before.   *migraines - hasn't had one since leaving her former place of employment. Still having speech issues.   *Was getting migraines at least 1-2 times/week. Have been stress related in past, but this was increase from where she had in the past. When she got them headache was lasting 2-3 days. Severity of headache was to point where she couldn't speak at all; numbness in right arm (used to be left). Migraine usually on left side (even before with left side pain) but then started on right side head. Left side vision was still "messed up"; lost peripheral vision from left eye. Also weakness with right arm; also numb in arm up into cheek.   Has had other headaches on and off,but not sure what was trigger. No dizziness. And outside of migraine no change in vision, blurry vision, double vision.   No hormonal association.   Iron def - takes 2-3 times/week. Tries to take every other day.   Periods now are 3 days instead of a week. Was getting dizziness and weakness with periods previously. Doing better with this. Heaviness and length of period are much improved. One heavy day rather than 3 or 4.   No Known Allergies Current Meds  Medication Sig  . cyanocobalamin 1000 MCG tablet Take 1,000 mcg by mouth daily.  . norethindrone-ethinyl estradiol (JUNEL FE 1/20) 1-20 MG-MCG tablet Take 1 tablet by mouth daily.     Review of Systems  Constitutional: Negative for chills, fatigue and fever.  Eyes: Visual disturbance: just with migraine.  Respiratory: Negative for cough, chest tightness, shortness of breath and wheezing.   Cardiovascular: Negative for chest pain, palpitations and leg swelling.  Neurological: Positive for numbness and headaches.    Objective:  BP 116/80 (BP Location: Left Arm, Patient Position: Sitting, Cuff Size: Large)   Pulse 65   Temp 98.3 F (36.8 C) (Oral)   Ht 5\' 3"  (1.6 m)   Wt 187 lb 8 oz (85 kg)   LMP 05/17/2020 (Exact Date)   BMI 33.21 kg/m   Weight: 187 lb 8 oz (85 kg)   BP Readings from Last 3 Encounters:  05/24/20 116/80  09/28/19 110/64  08/11/19 100/70   Wt Readings from Last 3 Encounters:  05/24/20 187 lb 8 oz (85 kg)  09/28/19 187 lb (84.8 kg)  08/11/19 183 lb 3.2 oz (83.1 kg)    Physical Exam Constitutional:      General: She is not in acute distress.    Appearance: She is well-developed. She is not diaphoretic.  HENT:     Head: Normocephalic and atraumatic.     Right Ear: External ear normal.     Left Ear: External ear normal.  Eyes:     Conjunctiva/sclera: Conjunctivae normal.     Pupils: Pupils are equal, round, and reactive to light.  Neck:  Thyroid: No thyromegaly.  Cardiovascular:     Rate and Rhythm: Normal rate and regular rhythm.     Heart sounds: Normal heart sounds. No murmur heard.  No friction rub. No gallop.   Pulmonary:     Effort: Pulmonary effort is normal. No respiratory distress.     Breath sounds: Normal breath sounds. No wheezing or rales.  Musculoskeletal:     Cervical back: Neck supple.  Lymphadenopathy:     Cervical: No cervical adenopathy.  Skin:    General: Skin is warm and dry.  Neurological:     Mental Status: She is alert and oriented to person, place, and time.     Cranial Nerves: No cranial nerve deficit.     Motor: No abnormal muscle tone.     Deep Tendon Reflexes: Reflexes normal.     Reflex  Scores:      Tricep reflexes are 2+ on the right side and 2+ on the left side.      Bicep reflexes are 2+ on the right side and 2+ on the left side.      Brachioradialis reflexes are 2+ on the right side and 2+ on the left side.      Patellar reflexes are 2+ on the right side and 2+ on the left side. Psychiatric:        Behavior: Behavior normal.     Assessment/Plan  1. Atypical migraine Concerned for worsening of migraine sx and frequency. Will get imaging and consider further eval/treatment pending these results.  - MR BRAIN WO CONTRAST; Future - Comprehensive metabolic panel; Future - TSH; Future - D-dimer, Quantitative; Future - D-dimer, Quantitative - TSH - Comprehensive metabolic panel  2. Anemia, unspecified type Has been taking iron supplementation. Feels better overall. Also menses shorter/lighter with ocp.  - CBC with Differential/Platelet; Future - Vitamin B12; Future - Iron, TIBC and Ferritin Panel; Future - Iron, TIBC and Ferritin Panel - Vitamin B12 - CBC with Differential/Platelet  3. Vitamin D deficiency - VITAMIN D 25 Hydroxy (Vit-D Deficiency, Fractures); Future - VITAMIN D 25 Hydroxy (Vit-D Deficiency, Fractures)   Return for pending imaging, bloodwork results.    Theodis Shove, MD

## 2020-05-25 LAB — CBC WITH DIFFERENTIAL/PLATELET
Absolute Monocytes: 460 cells/uL (ref 200–950)
Basophils Absolute: 29 cells/uL (ref 0–200)
Basophils Relative: 0.4 %
Eosinophils Absolute: 73 cells/uL (ref 15–500)
Eosinophils Relative: 1 %
HCT: 44.5 % (ref 35.0–45.0)
Hemoglobin: 15.1 g/dL (ref 11.7–15.5)
Lymphs Abs: 2577 cells/uL (ref 850–3900)
MCH: 29.7 pg (ref 27.0–33.0)
MCHC: 33.9 g/dL (ref 32.0–36.0)
MCV: 87.4 fL (ref 80.0–100.0)
MPV: 12.6 fL — ABNORMAL HIGH (ref 7.5–12.5)
Monocytes Relative: 6.3 %
Neutro Abs: 4161 cells/uL (ref 1500–7800)
Neutrophils Relative %: 57 %
Platelets: 254 10*3/uL (ref 140–400)
RBC: 5.09 10*6/uL (ref 3.80–5.10)
RDW: 11.9 % (ref 11.0–15.0)
Total Lymphocyte: 35.3 %
WBC: 7.3 10*3/uL (ref 3.8–10.8)

## 2020-05-25 LAB — TSH: TSH: 2.26 mIU/L

## 2020-05-25 LAB — COMPREHENSIVE METABOLIC PANEL
AG Ratio: 1.6 (calc) (ref 1.0–2.5)
ALT: 23 U/L (ref 6–29)
AST: 24 U/L (ref 10–30)
Albumin: 4.5 g/dL (ref 3.6–5.1)
Alkaline phosphatase (APISO): 42 U/L (ref 31–125)
BUN: 14 mg/dL (ref 7–25)
CO2: 25 mmol/L (ref 20–32)
Calcium: 10.1 mg/dL (ref 8.6–10.2)
Chloride: 101 mmol/L (ref 98–110)
Creat: 0.87 mg/dL (ref 0.50–1.10)
Globulin: 2.9 g/dL (calc) (ref 1.9–3.7)
Glucose, Bld: 79 mg/dL (ref 65–99)
Potassium: 4.4 mmol/L (ref 3.5–5.3)
Sodium: 138 mmol/L (ref 135–146)
Total Bilirubin: 0.7 mg/dL (ref 0.2–1.2)
Total Protein: 7.4 g/dL (ref 6.1–8.1)

## 2020-05-25 LAB — IRON,TIBC AND FERRITIN PANEL
%SAT: 28 % (calc) (ref 16–45)
Ferritin: 41 ng/mL (ref 16–154)
Iron: 124 ug/dL (ref 40–190)
TIBC: 445 mcg/dL (calc) (ref 250–450)

## 2020-05-25 LAB — VITAMIN D 25 HYDROXY (VIT D DEFICIENCY, FRACTURES): Vit D, 25-Hydroxy: 39 ng/mL (ref 30–100)

## 2020-05-25 LAB — VITAMIN B12: Vitamin B-12: 786 pg/mL (ref 200–1100)

## 2020-05-25 LAB — D-DIMER, QUANTITATIVE: D-Dimer, Quant: <0.19 mcg/mL FEU (ref <0.50)

## 2020-06-14 ENCOUNTER — Other Ambulatory Visit: Payer: BC Managed Care – PPO

## 2020-06-27 DIAGNOSIS — Z23 Encounter for immunization: Secondary | ICD-10-CM | POA: Diagnosis not present

## 2020-08-01 DIAGNOSIS — Z20822 Contact with and (suspected) exposure to covid-19: Secondary | ICD-10-CM | POA: Diagnosis not present

## 2020-09-20 ENCOUNTER — Telehealth: Payer: BC Managed Care – PPO | Admitting: Nurse Practitioner

## 2020-09-20 DIAGNOSIS — J00 Acute nasopharyngitis [common cold]: Secondary | ICD-10-CM | POA: Diagnosis not present

## 2020-09-20 MED ORDER — FLUTICASONE PROPIONATE 50 MCG/ACT NA SUSP: 2.0000 | Freq: Every day | NASAL | 6 refills | Status: DC

## 2020-09-20 NOTE — Progress Notes (Signed)
We are sorry you are not feeling well.  Here is how we plan to help!  Based on what you have shared with me, it looks like you may have a viral upper respiratory infection.  Upper respiratory infections are caused by a large number of viruses; however, rhinovirus is the most common cause.   Symptoms vary from person to person, with common symptoms including sore throat, cough, fatigue or lack of energy and feeling of general discomfort.  A low-grade fever of up to 100.4 may present, but is often uncommon.  Symptoms vary however, and are closely related to a person's age or underlying illnesses.  The most common symptoms associated with an upper respiratory infection are nasal discharge or congestion, cough, sneezing, headache and pressure in the ears and face.  These symptoms usually persist for about 3 to 10 days, but can last up to 2 weeks.  It is important to know that upper respiratory infections do not cause serious illness or complications in most cases.    Upper respiratory infections can be transmitted from person to person, with the most common method of transmission being a person's hands.  The virus is able to live on the skin and can infect other persons for up to 2 hours after direct contact.  Also, these can be transmitted when someone coughs or sneezes; thus, it is important to cover the mouth to reduce this risk.  To keep the spread of the illness at bay, good hand hygiene is very important.  This is an infection that is most likely caused by a virus. There are no specific treatments other than to help you with the symptoms until the infection runs its course.  We are sorry you are not feeling well.  Here is how we plan to help!   For nasal congestion, you may use an oral decongestants such as Mucinex D or if you have glaucoma or high blood pressure use plain Mucinex.  Saline nasal spray or nasal drops can help and can safely be used as often as needed for congestion.  For your congestion,  I have prescribed Fluticasone nasal spray one spray in each nostril twice a day  If you do not have a history of heart disease, hypertension, diabetes or thyroid disease, prostate/bladder issues or glaucoma, you may also use Sudafed to treat nasal congestion.  It is highly recommended that you consult with a pharmacist or your primary care physician to ensure this medication is safe for you to take.     If you have a cough, you may use cough suppressants such as Delsym and Robitussin.  If you have glaucoma or high blood pressure, you can also use Coricidin HBP.    If you have a sore or scratchy throat, use a saltwater gargle-  to  teaspoon of salt dissolved in a 4-ounce to 8-ounce glass of warm water.  Gargle the solution for approximately 15-30 seconds and then spit.  It is important not to swallow the solution.  You can also use throat lozenges/cough drops and Chloraseptic spray to help with throat pain or discomfort.  Warm or cold liquids can also be helpful in relieving throat pain.  For headache, pain or general discomfort, you can use Ibuprofen or Tylenol as directed.   Some authorities believe that zinc sprays or the use of Echinacea may shorten the course of your symptoms.   HOME CARE . Only take medications as instructed by your medical team. . Be sure to drink plenty   of fluids. Water is fine as well as fruit juices, sodas and electrolyte beverages. You may want to stay away from caffeine or alcohol. If you are nauseated, try taking small sips of liquids. How do you know if you are getting enough fluid? Your urine should be a pale yellow or almost colorless. . Get rest. . Taking a steamy shower or using a humidifier may help nasal congestion and ease sore throat pain. You can place a towel over your head and breathe in the steam from hot water coming from a faucet. . Using a saline nasal spray works much the same way. . Cough drops, hard candies and sore throat lozenges may ease your  cough. . Avoid close contacts especially the very young and the elderly . Cover your mouth if you cough or sneeze . Always remember to wash your hands.   GET HELP RIGHT AWAY IF: . You develop worsening fever. . If your symptoms do not improve within 10 days . You develop yellow or green discharge from your nose over 3 days. . You have coughing fits . You develop a severe head ache or visual changes. . You develop shortness of breath, difficulty breathing or start having chest pain . Your symptoms persist after you have completed your treatment plan  MAKE SURE YOU   Understand these instructions.  Will watch your condition.  Will get help right away if you are not doing well or get worse.  Your e-visit answers were reviewed by a board certified advanced clinical practitioner to complete your personal care plan. Depending upon the condition, your plan could have included both over the counter or prescription medications. Please review your pharmacy choice. If there is a problem, you may call our nursing hot line at and have the prescription routed to another pharmacy. Your safety is important to us. If you have drug allergies check your prescription carefully.   You can use MyChart to ask questions about today's visit, request a non-urgent call back, or ask for a work or school excuse for 24 hours related to this e-Visit. If it has been greater than 24 hours you will need to follow up with your provider, or enter a new e-Visit to address those concerns. You will get an e-mail in the next two days asking about your experience.  I hope that your e-visit has been valuable and will speed your recovery. Thank you for using e-visits.   5-10 minutes spent reviewing and documenting in chart.    

## 2020-09-26 ENCOUNTER — Other Ambulatory Visit: Payer: Self-pay | Admitting: Family Medicine

## 2020-10-04 DIAGNOSIS — Z20822 Contact with and (suspected) exposure to covid-19: Secondary | ICD-10-CM | POA: Diagnosis not present

## 2020-10-04 DIAGNOSIS — R0981 Nasal congestion: Secondary | ICD-10-CM | POA: Diagnosis not present

## 2020-10-04 DIAGNOSIS — R6883 Chills (without fever): Secondary | ICD-10-CM | POA: Diagnosis not present

## 2020-10-04 DIAGNOSIS — R519 Headache, unspecified: Secondary | ICD-10-CM | POA: Diagnosis not present

## 2021-03-15 IMAGING — DX DG ANKLE COMPLETE 3+V*R*
4 series · 4 of 4 positions shown · non-contrast
Comparison: None.

CLINICAL DATA: Right ankle pain since a twisting injury 2 weeks
ago.

EXAM:
RIGHT ANKLE - COMPLETE 3+ VIEW

[ankle ap (1 of 4)]
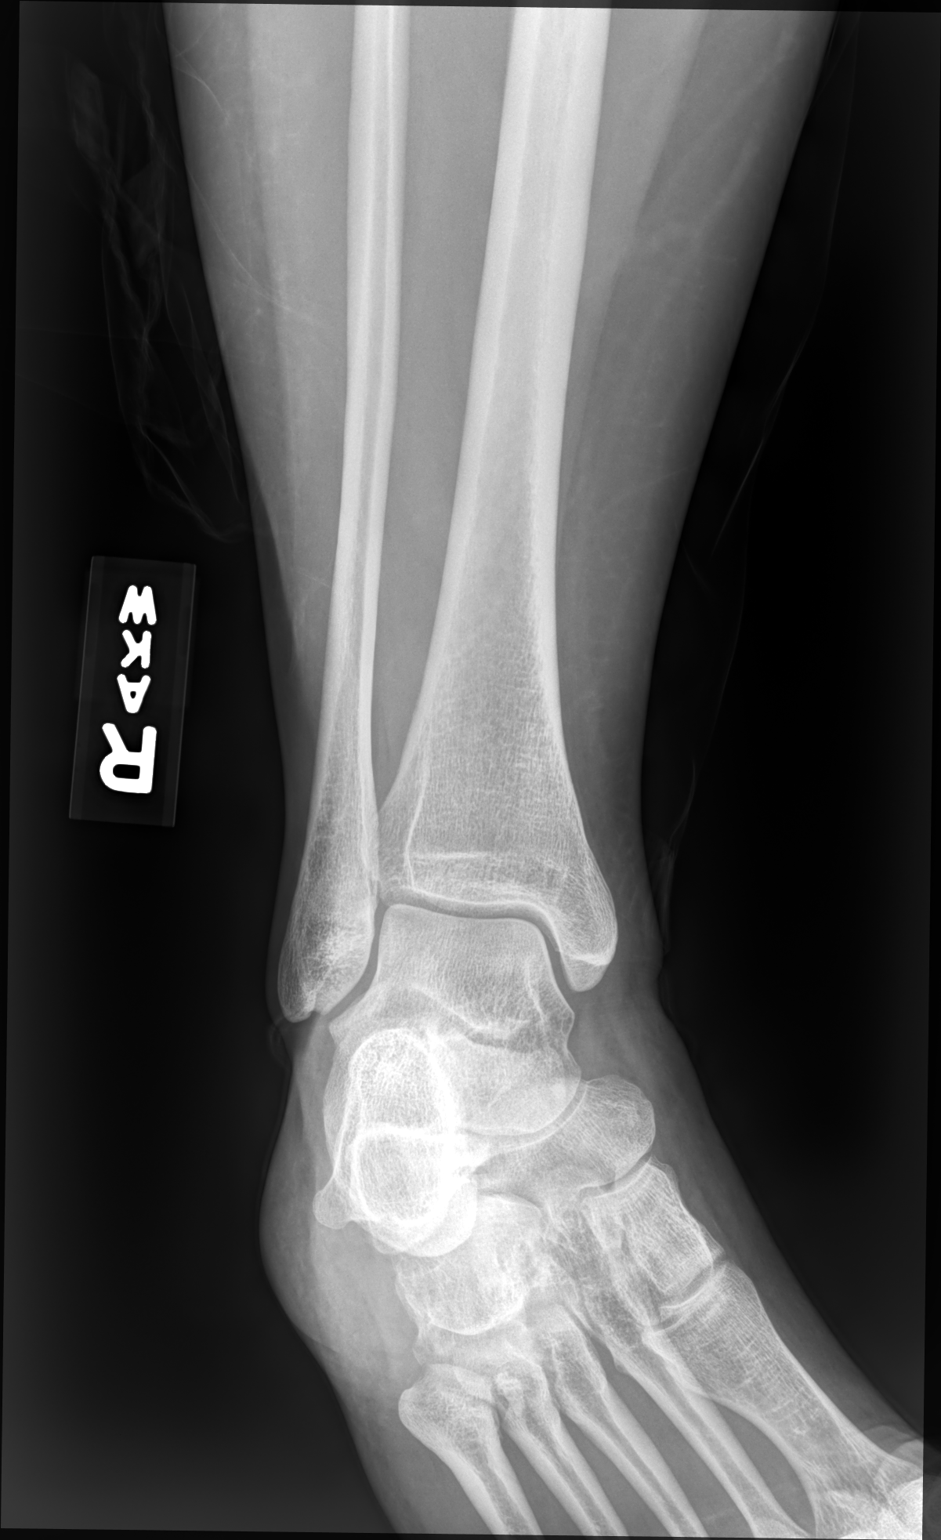

[ankle ap (2 of 4)]
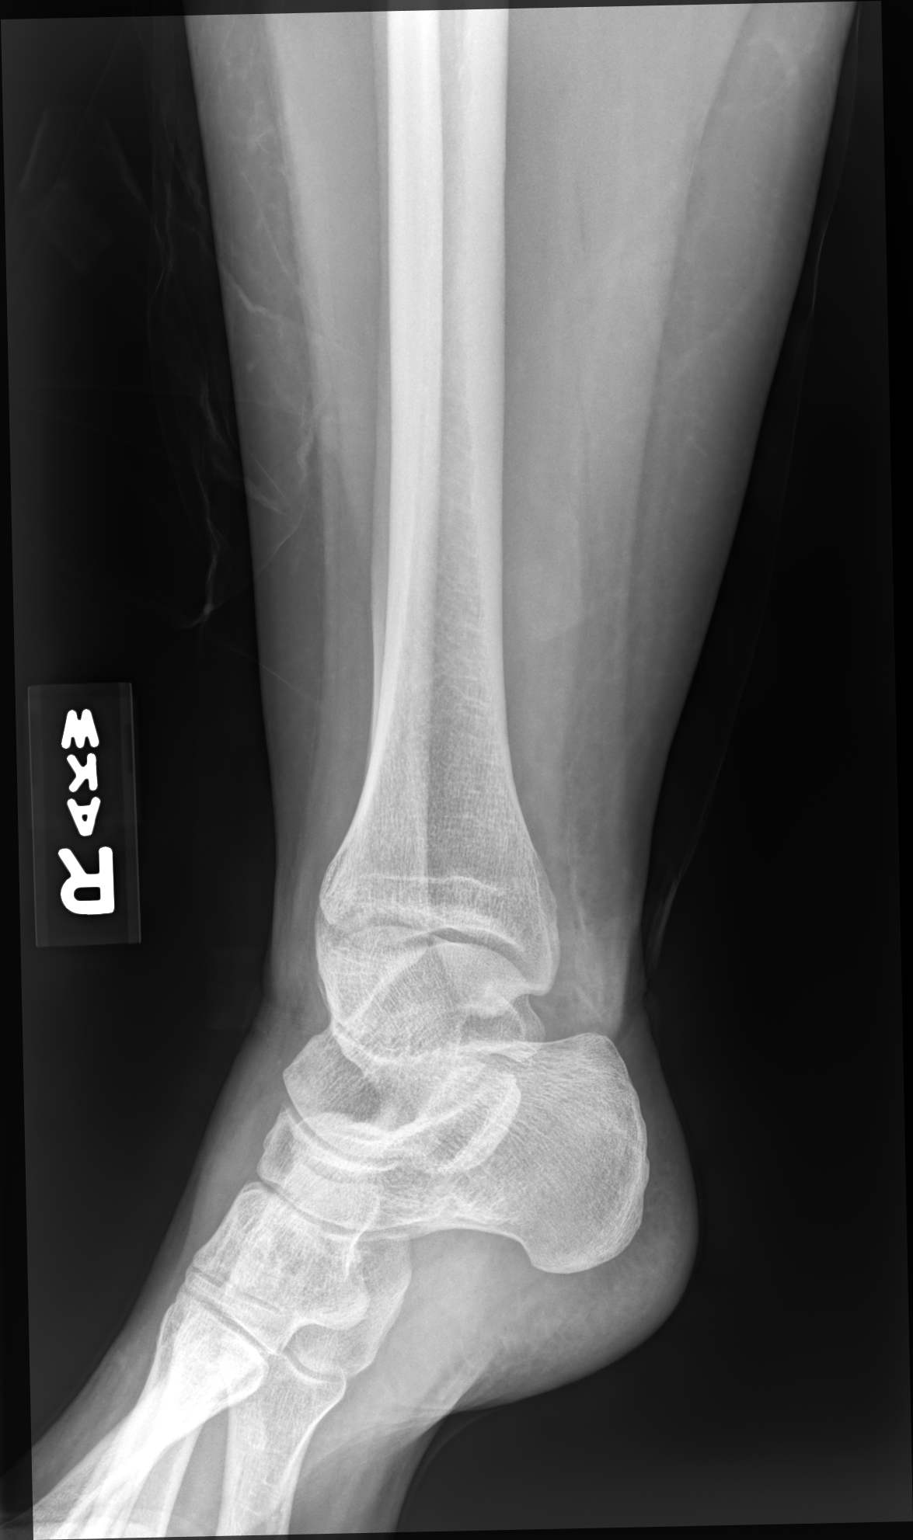

[ankle ap (3 of 4)]
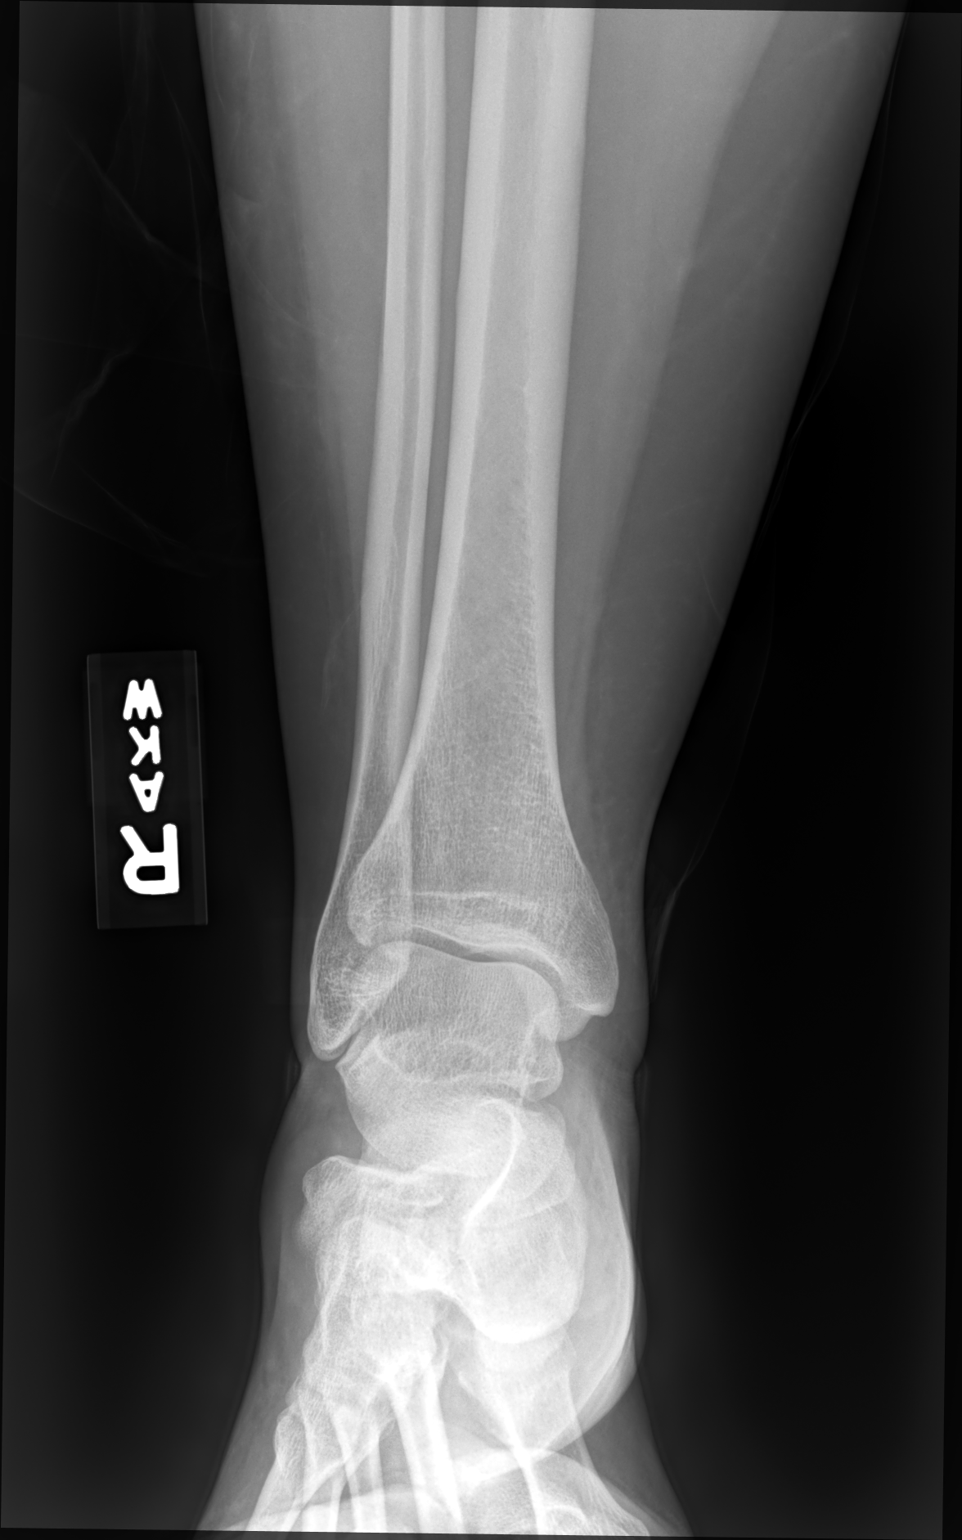

[ankle ap (4 of 4)]
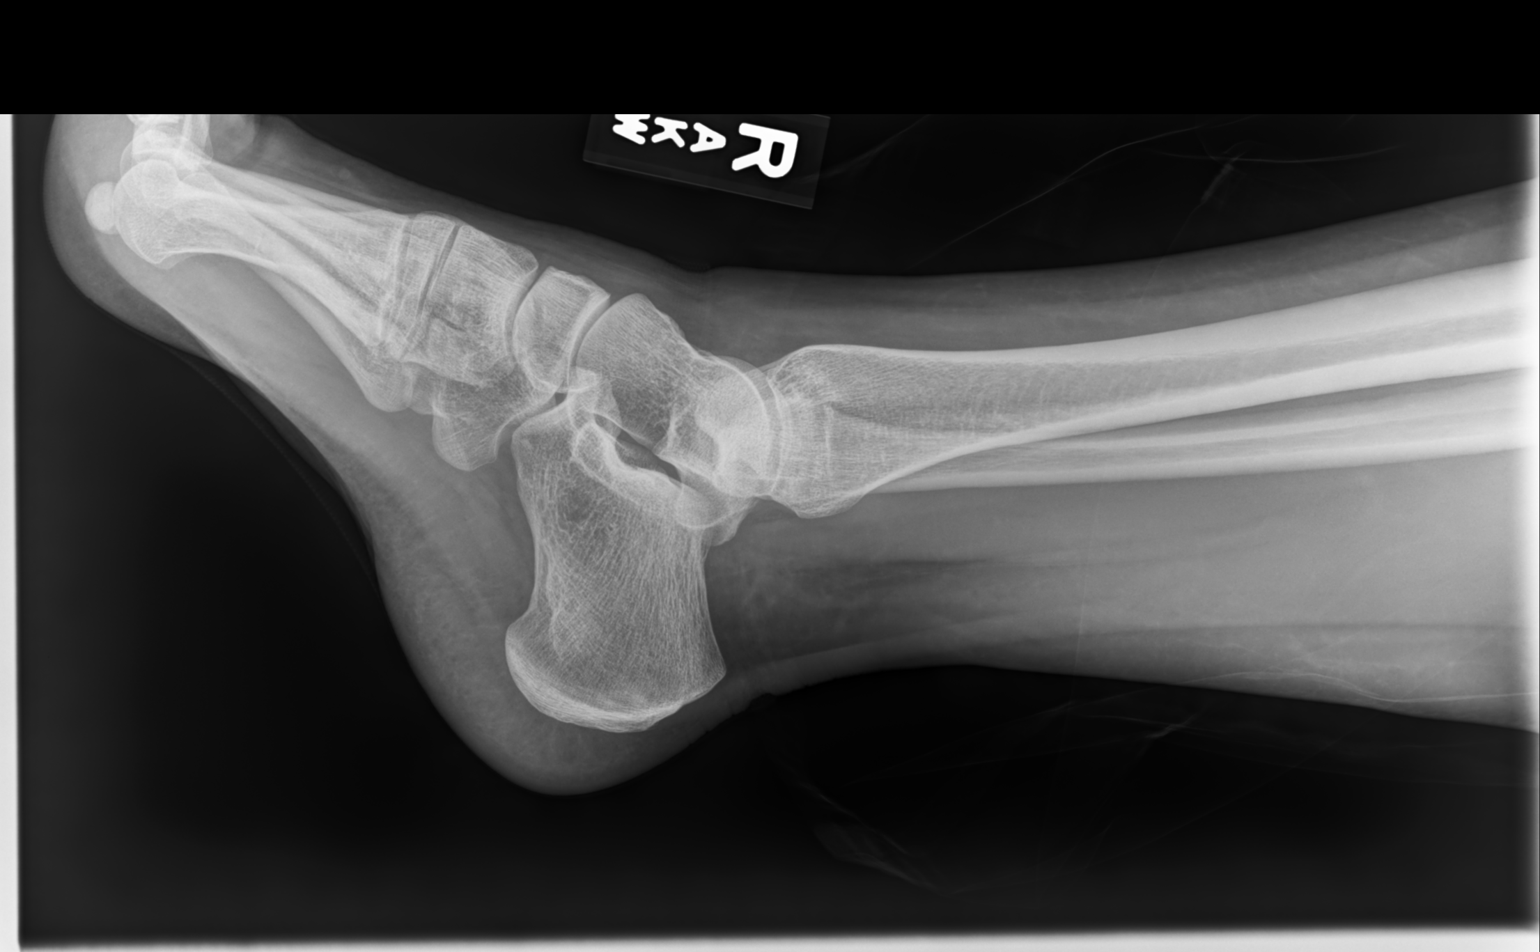

[4 of 4 positions shown; findings below may reference images not displayed]

FINDINGS: There is no evidence of fracture, dislocation, or joint effusion.
There is no evidence of arthropathy or other focal bone abnormality.
Slight soft tissue swelling.
IMPRESSION: Soft tissue swelling. Otherwise, normal exam.

## 2021-07-03 DIAGNOSIS — Z23 Encounter for immunization: Secondary | ICD-10-CM | POA: Diagnosis not present

## 2021-09-03 ENCOUNTER — Other Ambulatory Visit: Payer: Self-pay | Admitting: Family Medicine

## 2021-09-06 ENCOUNTER — Encounter: Payer: Self-pay | Admitting: Family Medicine

## 2021-09-07 MED ORDER — NORETHIN ACE-ETH ESTRAD-FE 1-20 MG-MCG PO TABS: 1.0000 | ORAL_TABLET | Freq: Every day | ORAL | 3 refills | Status: AC

## 2022-09-19 ENCOUNTER — Telehealth: Payer: Self-pay | Admitting: Physician Assistant

## 2022-09-19 DIAGNOSIS — J019 Acute sinusitis, unspecified: Secondary | ICD-10-CM

## 2022-09-19 DIAGNOSIS — B9689 Other specified bacterial agents as the cause of diseases classified elsewhere: Secondary | ICD-10-CM

## 2022-09-19 MED ORDER — AMOXICILLIN-POT CLAVULANATE 875-125 MG PO TABS: 1.0000 | ORAL_TABLET | Freq: Two times a day (BID) | ORAL | 0 refills | Status: DC

## 2022-09-19 NOTE — Progress Notes (Signed)

## 2024-04-20 ENCOUNTER — Other Ambulatory Visit: Payer: Self-pay | Admitting: Medical Genetics

## 2024-06-10 ENCOUNTER — Telehealth: Admitting: Physician Assistant

## 2024-06-10 DIAGNOSIS — K047 Periapical abscess without sinus: Secondary | ICD-10-CM | POA: Diagnosis not present

## 2024-06-10 DIAGNOSIS — T3695XA Adverse effect of unspecified systemic antibiotic, initial encounter: Secondary | ICD-10-CM | POA: Diagnosis not present

## 2024-06-10 DIAGNOSIS — B379 Candidiasis, unspecified: Secondary | ICD-10-CM | POA: Diagnosis not present

## 2024-06-10 MED ORDER — FLUCONAZOLE 150 MG PO TABS: 150.0000 mg | ORAL_TABLET | Freq: Once | ORAL | 0 refills | Status: AC

## 2024-06-10 MED ORDER — AMOXICILLIN-POT CLAVULANATE 875-125 MG PO TABS: 1.0000 | ORAL_TABLET | Freq: Two times a day (BID) | ORAL | 0 refills | Status: DC

## 2024-06-10 NOTE — Addendum Note (Signed)
 Addended by: VIVIENNE DELON HERO on: 06/10/2024 11:30 AM   Modules accepted: Orders

## 2024-06-10 NOTE — Progress Notes (Signed)

## 2024-06-25 ENCOUNTER — Other Ambulatory Visit: Payer: Self-pay | Admitting: Medical Genetics

## 2024-06-25 DIAGNOSIS — Z006 Encounter for examination for normal comparison and control in clinical research program: Secondary | ICD-10-CM

## 2024-07-01 ENCOUNTER — Telehealth: Payer: Self-pay | Admitting: Physician Assistant

## 2024-07-01 DIAGNOSIS — H6011 Cellulitis of right external ear: Secondary | ICD-10-CM

## 2024-07-01 NOTE — Progress Notes (Signed)
  Because you were recently on antibiotics and now with a yellow-green discharge and ear swelling, I feel your condition warrants further evaluation and I recommend that you be seen in a face-to-face visit for a thorough evaluation of your ear.   NOTE: There will be NO CHARGE for this E-Visit   If you are having a true medical emergency, please call 911.     For an urgent face to face visit, [facility] has multiple urgent care centers for your convenience.  Click the link below for the full list of locations and hours, walk-in wait times, appointment scheduling options and driving directions:  Urgent Care - [place], [place], [place], [place], [place], [place]  [place]     Your MyChart E-visit questionnaire answers were reviewed by a board certified advanced clinical practitioner to complete your personal care plan based on your specific symptoms.    Thank you for using e-Visits.

## 2024-10-10 ENCOUNTER — Telehealth: Payer: Self-pay | Admitting: Family

## 2024-10-10 DIAGNOSIS — U071 COVID-19: Secondary | ICD-10-CM

## 2024-10-10 MED ORDER — NAPROXEN 500 MG PO TABS: 500.0000 mg | ORAL_TABLET | Freq: Two times a day (BID) | ORAL | 0 refills | Status: AC

## 2024-10-10 MED ORDER — FLUTICASONE PROPIONATE 50 MCG/ACT NA SUSP: 2.0000 | Freq: Every day | NASAL | 6 refills | Status: AC

## 2024-10-10 MED ORDER — BENZONATATE 100 MG PO CAPS: 100.0000 mg | ORAL_CAPSULE | Freq: Two times a day (BID) | ORAL | 0 refills | Status: AC | PRN

## 2024-10-10 NOTE — Progress Notes (Signed)
 Your test for COVID-19 was positive, meaning that you were infected with the novel coronavirus and could give the germ to others.    Most people with these infections have a mild illness and can recover at home without medical care. Do not leave your home, except to get medical care.  DO not visit public areas and do not go to places where you are unable to wear a mask. It is important for you to stay home to take care of yourself and to help protect other people in your home and community.   Isolation Instructions:  You are to isolate at home for now until you have taken your home COVID/Flu test and notified our team of your results, at which time further isolation instructions will be given.  If you must be around other household members who do not have symptoms, you need to make sure that both you and the family members are masking consistently with a high-quality mask, even while in the home.  If you note any worsening of symptoms despite treatment, please seek an IN-PERSON evaluation ASAP. If you note any significant shortness of breath or any chest pain, please seek immediate ER evaluation. Please do not delay care!  Go to the nearest hospital ED for assessment if fever/cough/breathlessness are severe or illness seems like a threat to life.    The following symptoms may appear 2-14 days after exposure: Fever Cough Shortness of breath or difficulty breathing Chills Repeated shaking with chills Muscle pain Headache Sore throat New loss of taste or smell Fatigue Congestion or runny nose Nausea or vomiting Diarrhea   For symptoms,  I have prescribed Tessalon  Perles 100 mg. You may take 1-2 capsules every 8 hours as needed for cough and I have prescribed Fluticasone  nasal spray 2 sprays in each nostril one time per dayasal spray 2 sprays in each nostril one time per day. I have sent in naprosyn  500 mg you can take twice a day for pain.  You may also take acetaminophen (Tylenol) as  needed for fever.   Reduce your risk of any infection by using the same precautions used for avoiding the common cold or flu:  Wash your hands often with soap and warm water for at least 20 seconds.  If soap and water are not readily available, use an alcohol-based hand sanitizer with at least 60% alcohol.  If coughing or sneezing, cover your mouth and nose by coughing or sneezing into the elbow areas of your shirt or coat, into a tissue or into your sleeve (not your hands). Avoid shaking hands with others and consider head nods or verbal greetings only. Avoid touching your eyes, nose, or mouth with unwashed hands.  Avoid close contact with people who are sick. Avoid places or events with large numbers of people in one location, like concerts or sporting events. Carefully consider travel plans you have or are making. If you are planning any travel outside or inside the [place] , visit the CDC's Travelers' Health webpage for the latest health notices. If you have some symptoms but not all symptoms, continue to monitor at home and seek medical attention if your symptoms worsen. If you are having a medical emergency, call 911.  HOME CARE Only take medications as instructed by your medical team. Drink plenty of fluids and get plenty of rest. A steam or ultrasonic humidifier can help if you have congestion.   GET HELP RIGHT AWAY IF YOU HAVE EMERGENCY WARNING SIGNS** FOR COVID-19. If you or  someone is showing any of these signs seek emergency medical care immediately. Call 911 or proceed to your closest emergency facility if: You develop worsening high fever. Trouble breathing. Bluish lips or face. Persistent pain or pressure in the chest. New confusion. Inability to wake or stay awake. You cough up blood. Your symptoms become more severe.  **This list is not all possible symptoms. Contact your medical provider for any symptoms that are sever or concerning to you.   MAKE SURE YOU  Understand  these instructions. Will watch your condition. Will get help right away if you are not doing well or get worse.  Your e-visit answers were reviewed by a board certified advanced clinical practitioner to complete your personal care plan.  Depending on the condition, your plan could have included both over the counter or prescription medications.  If there is a problem, please reply once you have received a response from your provider.  Your safety is important to us .  If you have drug allergies check your prescription carefully.    You can use MyChart to ask questions about today's visit, request a non-urgent call back, or ask for a work or school excuse for 24 hours related to this e-Visit. If it has been greater than 24 hours you will need to follow up with your provider, or enter a new e-Visit to address those concerns. You will get an e-mail in the next two days asking about your experience.  I hope that your e-visit has been valuable and will speed your recovery. Thank you for using e-visits.   I have spent 5 minutes in review of e-visit questionnaire, review and updating patient chart, medical decision making and response to patient.   Bari Learn, FNP
